# Patient Record
Sex: Female | Born: 1965 | Hispanic: Yes | Marital: Married | Smoking: Never smoker
Health system: Southern US, Community
[De-identification: ages and names within clinical notes are randomized; demographics above are authoritative.]

## PROBLEM LIST (undated history)

## (undated) DIAGNOSIS — E785 Hyperlipidemia, unspecified: Secondary | ICD-10-CM

## (undated) DIAGNOSIS — E119 Type 2 diabetes mellitus without complications: Secondary | ICD-10-CM

## (undated) HISTORY — PX: NO PAST SURGERIES: SHX2092

---

## 2020-06-24 ENCOUNTER — Ambulatory Visit
Admission: EM | Admit: 2020-06-24 | Discharge: 2020-06-24 | Disposition: A | Discharge status: Home / Self Care | Payer: BLUE CROSS/BLUE SHIELD | Attending: Family Medicine | Admitting: Family Medicine

## 2020-06-24 ENCOUNTER — Other Ambulatory Visit: Payer: Self-pay

## 2020-06-24 DIAGNOSIS — M79641 Pain in right hand: Secondary | ICD-10-CM

## 2020-06-24 HISTORY — DX: Hyperlipidemia, unspecified: E78.5

## 2020-06-24 HISTORY — DX: Type 2 diabetes mellitus without complications: E11.9

## 2020-06-24 MED ORDER — PREDNISONE 10 MG PO TABS: ORAL_TABLET | ORAL | 0 refills | Status: DC

## 2020-06-24 NOTE — Discharge Instructions (Signed)
Medication as prescribed. ° °Follow up with PCP. ° °Take care ° °Dr. Kamarii Buren  °

## 2020-06-24 NOTE — ED Provider Notes (Signed)
[facility]    CSN: [id number] Arrival date & time: 06/24/20  1412      History   Chief Complaint Chief Complaint  Patient presents with  . Hand Pain    right   HPI   54 year old female presents with the above complaint.  Started approximately 2 weeks ago.  She reports pain at the [facility] joint of the right hand.  Was seen at [facility] on 10/7.  Was thought to be secondary to gout.  She was treated with indomethacin and oxycodone.  She has had some improvement but no resolution.  Pain currently 5/10 in severity.  No other associated symptoms.  No fall, trauma, injury.  No other complaints.  Past Medical History:  Diagnosis Date  . Diabetes mellitus without complication (HCC)   . Hyperlipidemia    Home Medications    Prior to Admission medications   Medication Sig Start Date End Date Taking? Authorizing Provider  atorvastatin (LIPITOR) 10 MG tablet Take 10 mg by mouth daily. 04/09/20  Yes [provider]  glimepiride (AMARYL) 2 MG tablet Take 2 mg by mouth daily. 04/09/20  Yes [provider]  metFORMIN (GLUCOPHAGE) 500 MG tablet Take 500 mg by mouth daily. 04/09/20  Yes [provider]  predniSONE (DELTASONE) 10 MG tablet 50 mg daily x 2 days, then 40 mg daily x 2 days, then 30 mg daily x 2 days, then 20 mg daily x 2 days, then 10 mg daily x 2 days. 06/24/20   Tommie Sams, DO   Social History Social History   Tobacco Use  . Smoking status: Never Smoker  . Smokeless tobacco: Never Used  Vaping Use  . Vaping Use: Never used  Substance Use Topics  . Alcohol use: Never  . Drug use: Never     Allergies   Patient has no known allergies.   Review of Systems Review of Systems  Musculoskeletal:       Right hand pain.   Physical Exam Triage Vital Signs ED Triage Vitals  Enc Vitals Group     BP 06/24/20 1532 111/80     Pulse Rate 06/24/20 1532 99     Resp 06/24/20 1532 18     Temp 06/24/20 1532 98.5 F (36.9 C)     Temp  Source 06/24/20 1532 Oral     SpO2 06/24/20 1532 97 %     Weight --      Height --      Head Circumference --      Peak Flow --      Pain Score 06/24/20 1537 5     Pain Loc --      Pain Edu? --      Excl. in GC? --    Updated Vital Signs BP 111/80 (BP Location: Right Arm)   Pulse 99   Temp 98.5 F (36.9 C) (Oral)   Resp 18   SpO2 97%   Visual Acuity Right Eye Distance:   Left Eye Distance:   Bilateral Distance:    Right Eye Near:   Left Eye Near:    Bilateral Near:     Physical Exam Constitutional:      General: She is not in acute distress.    Appearance: Normal appearance. She is not ill-appearing.  Eyes:     General:        Right eye: No discharge.        Left eye: No discharge.     Conjunctiva/sclera: Conjunctivae  normal.  Pulmonary:     Effort: Pulmonary effort is normal. No respiratory distress.  Musculoskeletal:     Comments: Right hand with tenderness at the [facility] joint.  No warmth.  No erythema.  Neurological:     Mental Status: She is alert.  Psychiatric:        Mood and Affect: Mood normal.        Behavior: Behavior normal.    UC Treatments / Results  Labs (all labs ordered are listed, but only abnormal results are displayed) Labs Reviewed - No data to display  EKG   Radiology No results found.  Procedures Procedures (including critical care time)  Medications Ordered in UC Medications - No data to display  Initial Impression / Assessment and Plan / UC Course  I have reviewed the triage vital signs and the nursing notes.  Pertinent labs & imaging results that were available during my care of the patient were reviewed by me and considered in my medical decision making (see chart for details).    54 year old female presents with pain at the [facility] joint.  Patient recently seen by [facility].  X-ray results reviewed.  Had some improvement with indomethacin.  Trial of prednisone.  Final Clinical Impressions(s) / UC Diagnoses   Final diagnoses:    Right hand pain     Discharge Instructions     Medication as prescribed.  Follow up with PCP.  Take care  Dr. Adriana Simas    ED Prescriptions    Medication Sig Dispense Auth. Provider   predniSONE (DELTASONE) 10 MG tablet 50 mg daily x 2 days, then 40 mg daily x 2 days, then 30 mg daily x 2 days, then 20 mg daily x 2 days, then 10 mg daily x 2 days. 30 tablet Tommie Sams, DO     PDMP not reviewed this encounter.   Tommie Sams, [place] 06/24/20 1732

## 2020-06-24 NOTE — ED Triage Notes (Addendum)
Patient complains of right hand pain that started a few weeks ago. States that are has been swollen at the base of thumb, denies injury to area.Patient saw [facility] [place] for this on 10/07 and rxed Indomethacin and oxycodone without relief.     Patient son is interpreting for patient, unable to get [employer] interpreting services to answer.

## 2021-05-06 ENCOUNTER — Ambulatory Visit (INDEPENDENT_AMBULATORY_CARE_PROVIDER_SITE_OTHER): Payer: Self-pay

## 2021-05-06 ENCOUNTER — Ambulatory Visit
Admission: EM | Admit: 2021-05-06 | Discharge: 2021-05-06 | Disposition: A | Discharge status: Home / Self Care | Payer: Self-pay | Attending: Physician Assistant | Admitting: Physician Assistant

## 2021-05-06 DIAGNOSIS — M7741 Metatarsalgia, right foot: Secondary | ICD-10-CM

## 2021-05-06 DIAGNOSIS — M25571 Pain in right ankle and joints of right foot: Secondary | ICD-10-CM

## 2021-05-06 DIAGNOSIS — M79671 Pain in right foot: Secondary | ICD-10-CM

## 2021-05-06 MED ORDER — MELOXICAM 7.5 MG PO TABS: 7.5000 mg | ORAL_TABLET | Freq: Every day | ORAL | 0 refills | Status: AC

## 2021-05-06 MED ORDER — IBUPROFEN 400 MG PO TABS
400.0000 mg | ORAL_TABLET | Freq: Once | ORAL | Status: AC
  Administered 2021-05-06: 400 mg via ORAL

## 2021-05-06 MED ORDER — MELOXICAM 7.5 MG PO TABS: 7.5000 mg | ORAL_TABLET | Freq: Every day | ORAL | 0 refills | Status: DC

## 2021-05-06 NOTE — ED Provider Notes (Signed)
[facility]    CSN: [id number] Arrival date & time: 05/06/21  1255      History   Chief Complaint Chief Complaint  Patient presents with   Foot Pain    right    HPI Martha Rush is a 55 y.o. female presenting with her son for complaints of right foot and ankle pain.  Patient says she was outside watering the garden yesterday and he is unsure if she twisted the foot but no known injury.  She apparently did get a lot of water in her sock and shoe and he is unsure if that has anything to do with her pain.  No associated swelling, numbness tingling or weakness.  She does have increased pain when she bears weight on the foot.  No similar problem in the past.  Has taken Tylenol without improvement in pain.  Patient's past medical history significant for diabetes and hyperlipidemia.  Her son is translating for her today since her primary language is Arabic.  HPI  Past Medical History:  Diagnosis Date   Diabetes mellitus without complication (HCC)    Hyperlipidemia     There are no problems to display for this patient.   No past surgical history on file.  OB History   No obstetric history on file.      Home Medications    Prior to Admission medications   Medication Sig Start Date End Date Taking? Authorizing Provider  atorvastatin (LIPITOR) 10 MG tablet Take 10 mg by mouth daily. 04/09/20  Yes [provider]  glimepiride (AMARYL) 2 MG tablet Take 2 mg by mouth daily. 04/09/20  Yes [provider]  meloxicam (MOBIC) 7.5 MG tablet Take 1 tablet (7.5 mg total) by mouth daily. 05/06/21 06/05/21 Yes Shirlee Latch, PA-C  metFORMIN (GLUCOPHAGE) 500 MG tablet Take 500 mg by mouth daily. 04/09/20  Yes [provider]  predniSONE (DELTASONE) 10 MG tablet 50 mg daily x 2 days, then 40 mg daily x 2 days, then 30 mg daily x 2 days, then 20 mg daily x 2 days, then 10 mg daily x 2 days. 06/24/20   Tommie Sams, DO    Family History No family history on  file.  Social History Social History   Tobacco Use   Smoking status: Never   Smokeless tobacco: Never  Vaping Use   Vaping Use: Never used  Substance Use Topics   Alcohol use: Never   Drug use: Never     Allergies   Patient has no known allergies.   Review of Systems Review of Systems  Musculoskeletal:  Positive for arthralgias. Negative for gait problem and joint swelling.  Skin:  Negative for color change, rash and wound.  Neurological:  Negative for weakness and numbness.    Physical Exam Triage Vital Signs ED Triage Vitals  Enc Vitals Group     BP 05/06/21 1323 108/63     Pulse Rate 05/06/21 1323 86     Resp 05/06/21 1323 18     Temp 05/06/21 1323 98.6 F (37 C)     Temp Source 05/06/21 1323 Oral     SpO2 05/06/21 1323 99 %     Weight 05/06/21 1321 140 lb (63.5 kg)     Height 05/06/21 1322 5\' 3"  (1.6 m)     Head Circumference --      Peak Flow --      Pain Score 05/06/21 1321 10     Pain Loc --  Pain Edu? --      Excl. in GC? --    No data found.  Updated Vital Signs BP 108/63 (BP Location: Left Arm)   Pulse 86   Temp 98.6 F (37 C) (Oral)   Resp 18   Ht 5\' 3"  (1.6 m)   Wt 140 lb (63.5 kg)   SpO2 99%   BMI 24.80 kg/m      Physical Exam Vitals and nursing note reviewed.  Constitutional:      General: She is not in acute distress.    Appearance: Normal appearance. She is not ill-appearing or toxic-appearing.  HENT:     Head: Normocephalic and atraumatic.  Eyes:     General: No scleral icterus.       Right eye: No discharge.        Left eye: No discharge.     Conjunctiva/sclera: Conjunctivae normal.  Cardiovascular:     Rate and Rhythm: Normal rate and regular rhythm.     Pulses: Normal pulses.  Pulmonary:     Effort: Pulmonary effort is normal. No respiratory distress.  Musculoskeletal:     Cervical back: Neck supple.     Right ankle: Swelling present. Tenderness present over the lateral malleolus and ATF ligament. Normal range of  motion (mild lateral ankle swelling). Normal pulse.     Right foot: Normal range of motion. Swelling (mild swelling diffusely of metatarsals) and tenderness (TTP diffusely of  metatarsals) present.  Skin:    General: Skin is dry.  Neurological:     General: No focal deficit present.     Mental Status: She is alert. Mental status is at baseline.     Motor: No weakness.     Gait: Gait normal.  Psychiatric:        Mood and Affect: Mood normal.        Behavior: Behavior normal.        Thought Content: Thought content normal.     UC Treatments / Results  Labs (all labs ordered are listed, but only abnormal results are displayed) Labs Reviewed - No data to display  EKG   Radiology DG Ankle Complete Right  Result Date: 05/06/2021 CLINICAL DATA:  Right foot and ankle pain EXAM: RIGHT FOOT COMPLETE - 3+ VIEW; RIGHT ANKLE - COMPLETE 3+ VIEW COMPARISON:  None. FINDINGS: There is no evidence of fracture or dislocation of the right foot or ankle. No cortical thickening or periostitis. Small os peroneum. There is no evidence of arthropathy or other focal bone abnormality. Soft tissues are unremarkable. IMPRESSION: Negative. Electronically Signed   By: 05/08/2021 D.O.   On: 05/06/2021 14:28   DG Foot Complete Right  Result Date: 05/06/2021 CLINICAL DATA:  Right foot and ankle pain EXAM: RIGHT FOOT COMPLETE - 3+ VIEW; RIGHT ANKLE - COMPLETE 3+ VIEW COMPARISON:  None. FINDINGS: There is no evidence of fracture or dislocation of the right foot or ankle. No cortical thickening or periostitis. Small os peroneum. There is no evidence of arthropathy or other focal bone abnormality. Soft tissues are unremarkable. IMPRESSION: Negative. Electronically Signed   By: 05/08/2021 D.O.   On: 05/06/2021 14:28    Procedures Procedures (including critical care time)  Medications Ordered in UC Medications  ibuprofen (ADVIL) tablet 400 mg (400 mg Oral Given 05/06/21 1429)    Initial Impression /  Assessment and Plan / UC Course  I have reviewed the triage vital signs and the nursing notes.  Pertinent labs & imaging results that  were available during my care of the patient were reviewed by me and considered in my medical decision making (see chart for details).  55 year old female presenting with son for right foot and ankle pain since yesterday.  No specific injury.  Has taken Tylenol without improvement in her pain.  No associated numbness, weakness or tingling.  No fever or skin color changes.  X-rays of foot and ankle obtained today and independently viewed by me.  X-rays are negative.  Suspect metatarsalgia.  Treating this time with following RICE guidelines.  Additionally I have sent in meloxicam and advised use of over-the-counter Voltaren gel and continue Tylenol as needed.  Elevate and ice the foot.  She should be seen by [facility] if this is not improving in the next couple of weeks or symptoms worsen.  Final Clinical Impressions(s) / UC Diagnoses   Final diagnoses:  Foot pain, right  Acute right ankle pain  Metatarsalgia of right foot     Discharge Instructions      FOOT PAIN: X-rays are normal. Stressed avoiding painful activities . Reviewed RICE guidelines. Use medications as directed, including NSAIDs (meloxicam as prescribed). If no NSAIDs have been prescribed for you today, you may take Aleve or Motrin over the counter. May use Tylenol in between doses of NSAIDs.  Also consider use of OTC Voltaren gel and ACE wrap. If no improvement in the next 1-2 weeks, f/u with PCP or return to our office for reexamination, and please feel free to call or return at any time for any questions or concerns you may have and we will be happy to help you!         ED Prescriptions     Medication Sig Dispense Auth. Provider   meloxicam (MOBIC) 7.5 MG tablet Take 1 tablet (7.5 mg total) by mouth daily. 30 tablet Gareth Morgan      PDMP not reviewed this encounter.    Shirlee Latch, PA-C 05/06/21 1438

## 2021-05-06 NOTE — Discharge Instructions (Addendum)
FOOT PAIN: X-rays are normal. Stressed avoiding painful activities . Reviewed RICE guidelines. Use medications as directed, including NSAIDs (meloxicam as prescribed). If no NSAIDs have been prescribed for you today, you may take Aleve or Motrin over the counter. May use Tylenol in between doses of NSAIDs.  Also consider use of OTC Voltaren gel and ACE wrap. If no improvement in the next 1-2 weeks, f/u with PCP or return to our office for reexamination, and please feel free to call or return at any time for any questions or concerns you may have and we will be happy to help you!

## 2021-05-06 NOTE — ED Triage Notes (Signed)
Pt here with son who translates that Pt was outside watering the garden, water got into shoe and sock and since then foot has been in pain. No injury.

## 2023-02-10 ENCOUNTER — Other Ambulatory Visit: Payer: Self-pay

## 2023-02-10 ENCOUNTER — Ambulatory Visit: Payer: Self-pay | Admitting: Gerontology

## 2023-02-10 ENCOUNTER — Encounter: Payer: Self-pay | Admitting: Gerontology

## 2023-02-10 VITALS — BP 96/64 | HR 69 | Temp 98.0°F | Resp 16 | Ht 62.0 in | Wt 132.8 lb

## 2023-02-10 DIAGNOSIS — Z7689 Persons encountering health services in other specified circumstances: Secondary | ICD-10-CM | POA: Insufficient documentation

## 2023-02-10 DIAGNOSIS — E119 Type 2 diabetes mellitus without complications: Secondary | ICD-10-CM

## 2023-02-10 LAB — POCT GLYCOSYLATED HEMOGLOBIN (HGB A1C): Hemoglobin A1C: 7.5 % — AB (ref 4.0–5.6)

## 2023-02-10 LAB — GLUCOSE, POCT (MANUAL RESULT ENTRY): POC Glucose: 138 mg/dl — AB (ref 70–99)

## 2023-02-10 MED ORDER — LANCET DEVICE MISC
1.0000 | Freq: Three times a day (TID) | 0 refills | Status: AC
  Filled 2023-02-10: qty 1, 30d supply, fill #0

## 2023-02-10 MED ORDER — BLOOD GLUCOSE MONITOR SYSTEM W/DEVICE KIT
1.0000 | PACK | Freq: Three times a day (TID) | 0 refills | Status: AC
  Filled 2023-02-10: qty 1, 30d supply, fill #0

## 2023-02-10 MED ORDER — METFORMIN HCL 500 MG PO TABS
500.0000 mg | ORAL_TABLET | Freq: Two times a day (BID) | ORAL | 0 refills | Status: DC
  Filled 2023-02-10: qty 60, 30d supply, fill #0

## 2023-02-10 MED ORDER — BLOOD GLUCOSE TEST VI STRP
1.0000 | ORAL_STRIP | Freq: Three times a day (TID) | 0 refills | Status: AC
  Filled 2023-02-10: qty 100, 34d supply, fill #0

## 2023-02-10 MED ORDER — ACCU-CHEK SOFTCLIX LANCETS MISC
1.0000 | Freq: Three times a day (TID) | 0 refills | Status: AC
  Filled 2023-02-10: qty 100, 30d supply, fill #0

## 2023-02-10 NOTE — Progress Notes (Signed)
New Patient Office Visit  Subjective    Patient ID: Martha Rush, female    DOB: Apr 17, 1966  Age: 57 y.o. MRN: [id number]  CC:  Chief Complaint  Patient presents with   Establish Care    Patient has a history of diabetes, fasting blood sugar was 133 this morning.    HPI Martha Rush  is a 57 y/o female who has a history of type 2 diabetes, hyperlipidemia, and presents to establish care. She has a history of type 2 diabetes and takes Metformin/Glibenclamide 500 mg/5 mg daily, Tripass XR 25.5.1000 mg. Her HgbA1c checked during visit was 7.5% and her blood glucose was 138 mg/dl. She states that she checks her blood glucose bid, and her fasting readings are usually less than 140 mg/dl. She denies hypo/hyperglycemic symptoms, peripheral neuropathy and performs daily foot checks. Overall, she states that she's doing well and offers no further complaint.   Outpatient Encounter Medications as of 02/10/2023  Medication Sig   Accu-Chek Softclix Lancets lancets Use to test blood sugar in the morning, at noon, and at bedtime.   Blood Glucose Monitoring Suppl (BLOOD GLUCOSE MONITOR SYSTEM) w/Device KIT Use to test blood sugar in the morning, at noon, and at bedtime.   Glucose Blood (BLOOD GLUCOSE TEST STRIPS) STRP Use to test blood sugar in the morning, at noon, and at bedtime.   Lancet Device MISC Use to test blood sugar in the morning, at noon, and at bedtime.   UNABLE TO FIND Take 0.5 tablets by mouth daily. Emivanz 500mg /5mg  (Metformin hydrochloride/Glibenclamide) patient takes at lunch time   UNABLE TO FIND Take 1 tablet by mouth daily before breakfast. TriPass XR 25/01/999 mg (Empagliflozin 25mg / Linagliptin 5mg / Metformin 1000mg    metFORMIN (GLUCOPHAGE) 500 MG tablet Take 1 tablet (500 mg total) by mouth 2 (two) times daily with a meal.   [DISCONTINUED] atorvastatin (LIPITOR) 10 MG tablet Take 10 mg by mouth daily. (Patient not taking: Reported on 02/10/2023)   [DISCONTINUED] glimepiride (AMARYL)  2 MG tablet Take 2 mg by mouth daily. (Patient not taking: Reported on 02/10/2023)   [DISCONTINUED] metFORMIN (GLUCOPHAGE) 500 MG tablet Take 500 mg by mouth daily. (Patient not taking: Reported on 02/10/2023)   [DISCONTINUED] predniSONE (DELTASONE) 10 MG tablet 50 mg daily x 2 days, then 40 mg daily x 2 days, then 30 mg daily x 2 days, then 20 mg daily x 2 days, then 10 mg daily x 2 days.   No facility-administered encounter medications on file as of 02/10/2023.    Past Medical History:  Diagnosis Date   Diabetes mellitus without complication (HCC)    Hyperlipidemia     Past Surgical History:  Procedure Laterality Date   NO PAST SURGERIES      Family History  Problem Relation Age of Onset   Diabetes Mother    Heart disease Mother    Diabetes Father    Hyperlipidemia Father    Hypertension Father    Alcohol abuse Father    Cirrhosis Father    Diabetes Sister    Other Maternal Grandmother        unknown medical history   Other Maternal Grandfather        unknown medical history   Other Paternal Grandmother        unknown medical history   Other Paternal Grandfather        unknown medical history    Social History   Socioeconomic History   Marital status: Married    Spouse name:  Not on file   Number of children: Not on file   Years of education: Not on file   Highest education level: Not on file  Occupational History   Not on file  Tobacco Use   Smoking status: Never   Smokeless tobacco: Never  Vaping Use   Vaping Use: Never used  Substance and Sexual Activity   Alcohol use: Never   Drug use: Never   Sexual activity: Not on file  Other Topics Concern   Not on file  Social History Narrative   Not on file   Social Determinants of Health   Financial Resource Strain: Not on file  Food Insecurity: No Food Insecurity (02/10/2023)   Hunger Vital Sign    Worried About Running Out of Food in the Last Year: Never true    Ran Out of Food in the Last Year: Never true   Transportation Needs: No Transportation Needs (02/10/2023)   PRAPARE - Administrator, Civil Service (Medical): No    Lack of Transportation (Non-Medical): No  Physical Activity: Not on file  Stress: Not on file  Social Connections: Not on file  Intimate Partner Violence: Not At Risk (02/10/2023)   Humiliation, Afraid, Rape, and Kick questionnaire    Fear of Current or Ex-Partner: No    Emotionally Abused: No    Physically Abused: No    Sexually Abused: No    Review of Systems  Constitutional: Negative.   HENT: Negative.    Eyes: Negative.   Respiratory: Negative.    Cardiovascular: Negative.   Gastrointestinal: Negative.   Genitourinary: Negative.   Musculoskeletal: Negative.   Skin: Negative.   Neurological:  Positive for tingling (.intermittent neuropathy).  Endo/Heme/Allergies: Negative.   Psychiatric/Behavioral: Negative.          Objective    BP 96/64 (BP Location: Left Arm, Patient Position: Sitting, Cuff Size: Normal)   Pulse 69   Temp 98 F (36.7 C) (Oral)   Resp 16   Ht 5\' 2"  (1.575 m)   Wt 132 lb 12.8 oz (60.2 kg)   SpO2 98%   BMI 24.29 kg/m   Physical Exam HENT:     Head: Normocephalic and atraumatic.     Right Ear: Tympanic membrane normal.     Left Ear: Tympanic membrane normal.     Nose: Nose normal.     Mouth/Throat:     Mouth: Mucous membranes are moist.  Eyes:     Extraocular Movements: Extraocular movements intact.     Conjunctiva/sclera: Conjunctivae normal.     Pupils: Pupils are equal, round, and reactive to light.  Cardiovascular:     Rate and Rhythm: Normal rate and regular rhythm.     Pulses: Normal pulses.     Heart sounds: Normal heart sounds.  Pulmonary:     Effort: Pulmonary effort is normal.     Breath sounds: Normal breath sounds.  Abdominal:     General: Abdomen is flat. Bowel sounds are normal.     Palpations: Abdomen is soft.  Genitourinary:    Comments: Deferred per patient Musculoskeletal:         General: Normal range of motion.     Cervical back: Normal range of motion.  Skin:    General: Skin is warm.  Neurological:     General: No focal deficit present.     Mental Status: She is alert and oriented to person, place, and time. Mental status is at baseline.  Psychiatric:  Mood and Affect: Mood normal.        Behavior: Behavior normal.        Thought Content: Thought content normal.        Judgment: Judgment normal.         Assessment & Plan:     1. Encounter to establish care - Routine labs will be checked - CBC w/Diff; Future - Comp Met (CMET); Future - HgB A1c; Future - Lipid panel; Future - Urine Microalbumin w/creat. ratio; Future - Urine Microalbumin w/creat. ratio - Lipid panel - HgB A1c - Comp Met (CMET) - CBC w/Diff  2. Type 2 diabetes mellitus without complication, without long-term current use of insulin (HCC) - Her HgbA1c was 7.5%, her goal should be less than 7%, was started on Metformin, educated on medication side effects and advised to notify clinic. She will continue on low carb/non concentrated sweet diet and exercise as tolerated. She was provided with glucometer, advised to check, record and bring log to follow up appointment. - metFORMIN (GLUCOPHAGE) 500 MG tablet; Take 1 tablet (500 mg total) by mouth 2 (two) times daily with a meal.  Dispense: 60 tablet; Refill: 0 - Blood Glucose Monitoring Suppl (BLOOD GLUCOSE MONITOR SYSTEM) w/Device KIT; Use to test blood sugar in the morning, at noon, and at bedtime.  Dispense: 1 kit; Refill: 0 - Glucose Blood (BLOOD GLUCOSE TEST STRIPS) STRP; Use to test blood sugar in the morning, at noon, and at bedtime.  Dispense: 100 strip; Refill: 0 - Lancet Device MISC; Use to test blood sugar in the morning, at noon, and at bedtime.  Dispense: 1 each; Refill: 0 - Accu-Chek Softclix Lancets lancets; Use to test blood sugar in the morning, at noon, and at bedtime.  Dispense: 100 each; Refill: 0   Return in  about 22 days (around 03/04/2023), or if symptoms worsen or fail to improve.   Jenavieve Freda Trellis Paganini, NP

## 2023-02-10 NOTE — Patient Instructions (Signed)
???? ???????????? ???????? ????? ?????? ?? ???????? Carbohydrate Counting for Diabetes Mellitus, Adult ???? ???????????? ????? ??????? ???? ???????????? ???? ????????. ?????? ???????????? ???? ??? ????? ???? ?????? (????????) ?? ?????. ??? ??? ???? ??? ???????????? ???? ???????? ????? ?? ?????? ??? ????? ???????? ?? ?????. ???? ????? ?????? ??? ??????? ?? ??? ??????. ????? ???? ???????????? ??????? (??) ??? ????. ??? ?? ??????? ????? ??? ???????????? ???? ????? ??????? ????? ?? ?? ????. ????? ??? ??????? ??????? ??? ???. ?????? ??????? ??????? ??????? ?? ??? ??? ??????? ????? ??? ???????????? ???? ?????? ??????? ?? ?? ???? ?????? ??? ???? ?????. ?? ??????? ???? ????? ??? ?????????????  ?????? ???????????? ?? ??????? ???????:  ?????? ??? ????????? ???????.  ????????? ??????? ??????? ??????.  ????????? ??????? ??? ??????? ????????? ??????.  ??????? ?????? ???????.  ????? ????????.  ???????? ?????????? ??????? ??? ????? ?????? ????????? ???????? ??????? ?????? ???????. ??? ?????? ???? ???????????? ?? ??????? ??? ??????? ??????? ???? ???????????? ???????? ?? ??????. ????? ????? ?????? ?????? ?? ????? ??? ??????? ???????? ??? ??? ?? ??????. ????? ??????? ???? ????? ????????? ?? ??????. ????????? ?? ???? ??????? ???????? ???? ??????? ???????? ???? ?? ???? ???????? ??????? ??? ???? ??????? ?????????? ??????? ???? ???????? ???????. ?????:  ??? ?????.  ??????? ?? ??????? ???????? ?? ?? ????? ??? ?? ??? ???????? (??) ?? ???????????? ?? ?? ???. ???????? ??????? ????????? ??? ???? ????? ???? ?????????. ???? ???? ????? ?? ??? ???????????? ?? ?? ???. ????? ????? ?????? ????? ?????? ???? ???????????? ???? ?????????. ????? ????? ????? ???????? ??????? ?????? ??? ????? ???? ????? ??? ???????????? ???? ???? ?? ?? ????? ???? ???????? ??? ??????? ????????? ?????? ???? ???? ????? ?????? ?? ???? ????? ????????????.  ??? ????? ??????? ???? ????????? ?????? ?????? ?? ??? ??????? ??? ??? ?????.  ??? ??? ????? ????  ????????? ?????? ???????.  ???? ??? ????? ?? 15. ??????? ??????? ???? ????? ??? ????????????? ??? ??? ????? ????? 15 ?????? ?? ?????????????. ? ??? ???? ??????? ??? ?????? ????? ?? 10 ?????? (300 ??) ?? ????????? ?????? ?? ?????? ????? ?30 ?? ?? ???????????? (?????  15 ?? = 30 ??).  ??????? ??????? ??? ?????? ?????????? ???? ???? ???? ??? ???? ?? ??? ???? ?? ??????? ???? ?????? ???? ?????? ???????????? ???????? ?? ?? ??? ?? ????? ??????. ????? ??????? ??????? ??? ????? ????? ???????? ??????? ??????? ?????? ?????????????. ????? ?? ?? ??? ????? ??? ????? 15 ?? ?? ????????????:  ????? ????? ?? ?????.  ???? ??????? ???? ???? ??? ????? (15 ??).  ? ??? ?? ??????? (53 ??) ?? ????? ?????? ?? ???????.   ??? ?? 3 ?????? (85 ??) ?? ????????? ?? ????? ?????? ?? ?????? ??????? ???????? ??????.   ??? ?? 3 ?????? (85 ??) ?? ???????? ???????? ??? ???????? ?? ???? ????? ?? ?????.   ??? ?? 4 ?????? (120 ??) ?? ???? ??????? ???????.   ??? ?? 3 ?????? (85 ??) ?? ??????? ???????? ?? ????????? ??  ?? 3 ?????? (85 ??) ?? ??????? ??????? ??????? ?? ?????.   ??? ?? 4 ?????? ????? (118 ??) ?? ???? ???????.  ??? ?? 8 ?????? ????? (237 ??) ?? ??????.  ????? ????? ?? 4 ?????? (106 ??).   ???? ?????? ?? 2 ????? (63 ??).  ??? ?? ???????? ?? 5 ?????? (150 ??).  3 ????? ?? ????? ????? (28.3 ??) ?? ??????. ?? ?? ???? ??? ????? ?????? ????????????? ????? ??? ???????????? ?? ??? ?????? ?? ????????? ???? ??????? ??????? ?????. ???? ?? ???????  3 ????? (85 ??) ?? ???? ??????.  ? ??? ?? 4 ?????? (106 ??) ?? ????? ?????.   ??? ??  3 ?????? (85 ??) ?? ?????.  ??? ?? 8 ?????? ????? (237 ??) ?? ??????.  ??? ?? 5 ?????? (150 ??) ?? ???????? ??????? ?????? ????? ?? ?????. ?????? ???????????? 1. ??? ????????? ???????? ??? ????????????:  ?????.  ?????.  ??????.  ????????. 2. ???? ??? ????? ???? ???????? ?? ?? ????:  ????? ?? ?????.  ??? ????? ?? ?????.  ??? ????? ?? ??????.  ??? ????? ?? ????????. 3. ???? ?? ??? ??  ????? ?? 15 ??:  ????? ?? ?????  15 ?? = 30 ??.  ??? ????? ?? ?????  15 ?? = 15 ??.  ??? ????? ?? ??????  15 ?? = 15 ??.  ??? ????? ?? ????????  15 ?? = 15 ??. 4. ???? ???? ??????? ?????? ?????? ???? ???????????? ???? ????????:  30 ?? + 15 ?? + 15 ?? + 15 ?? = 75 ?? ?? ???????????? ?? ???????. ?? ??????? ??????? ???? ?????? ?? ????? ??? ?????? ??????  ?? ??? ??????? ?? ?? ????? ??????.  ???? ???????? ??????? ???????? ???????? ??????? ???????? ??????? ??????? ?????? ?????????? ?????? ??????? ???????.  ???? ?????? ??????. ???? ??????? ???? ????? ??? ????? ???? ???? ???.  ????? ?? ??????? ???????? ???????? ???? ????? ??? ???????? ???????. ????? ???????  ???? ??? ?????? ??? ??????? ???? ?? ???????????? ?? ?? ???? ?????? ???? ???? ?????.  ??? ?????? ????? ?????? ?????? ????? ?????? ????????. ????? ?????? ??? ?????? ?? ?????????:  ??????? ????????? ?????? (American Diabetes Association):? [email or web address]  ????? ?????? ??????? ???????? ???? (Centers for Disease Control and Prevention):? [email or web address]  ???????? ??????? ???????? ???????? (Academy of Nutrition and Dietetics):? [email or web address]  ????? ?????? ????? ???? ?????? ???????? (Association of Diabetes Care & Education Specialists):? [email or web address] ????  ???? ???????????? ????? ??????? ???? ???????????? ???? ????????.  ?????? ???????????? ???? ??? ????? ???? ?????? (????????) ?? ???.  ??? ??? ???? ??? ???????????? ???? ???????? ????? ?? ?????? ??? ????? ???????? ?? ?????. ???? ????? ?????? ??? ??????? ?? ???? ??????.  ?????? ??????? ??????? ??????? ?? ??? ??? ??????? ????? ??? ???????????? ???? ?????? ??????? ?? ?? ???? ?????? ??? ???? ?????. ??? ????? ?? ??? ????????? ?? ???? ?????? ????????? ???? ?????? ???? ??????? ??????. ???? ?? ?????? ??? ????? ???? ?? ???? ?? ???? ??????? ??????.? Document Revised: 05/10/2020 Document Reviewed: 05/10/2020 Elsevier Patient Education  2024 [employer].

## 2023-02-11 ENCOUNTER — Other Ambulatory Visit: Payer: Self-pay

## 2023-02-11 LAB — LIPID PANEL
Chol/HDL Ratio: 3.5 ratio (ref 0.0–4.4)
Cholesterol, Total: 195 mg/dL (ref 100–199)
HDL: 55 mg/dL (ref >39)
LDL Chol Calc (NIH): 122 mg/dL — ABNORMAL HIGH (ref 0–99)
Triglycerides: 103 mg/dL (ref 0–149)
VLDL Cholesterol Cal: 18 mg/dL (ref 5–40)

## 2023-02-11 LAB — COMPREHENSIVE METABOLIC PANEL
ALT: 20 IU/L (ref 0–32)
AST: 19 IU/L (ref 0–40)
Albumin/Globulin Ratio: 1.6 (ref 1.2–2.2)
Albumin: 4.6 g/dL (ref 3.8–4.9)
Alkaline Phosphatase: 85 IU/L (ref 44–121)
BUN/Creatinine Ratio: 25 — ABNORMAL HIGH (ref 9–23)
BUN: 15 mg/dL (ref 6–24)
Bilirubin Total: 0.4 mg/dL (ref 0.0–1.2)
CO2: 26 mmol/L (ref 20–29)
Calcium: 9.6 mg/dL (ref 8.7–10.2)
Chloride: 102 mmol/L (ref 96–106)
Creatinine, Ser: 0.59 mg/dL (ref 0.57–1.00)
Globulin, Total: 2.8 g/dL (ref 1.5–4.5)
Glucose: 160 mg/dL — ABNORMAL HIGH (ref 70–99)
Potassium: 4.7 mmol/L (ref 3.5–5.2)
Sodium: 138 mmol/L (ref 134–144)
Total Protein: 7.4 g/dL (ref 6.0–8.5)
eGFR: 105 mL/min/{1.73_m2} (ref >59)

## 2023-02-11 LAB — CBC WITH DIFFERENTIAL/PLATELET
Basophils Absolute: 0 10*3/uL (ref 0.0–0.2)
Basos: 1 %
EOS (ABSOLUTE): 0.5 10*3/uL — ABNORMAL HIGH (ref 0.0–0.4)
Eos: 8 %
Hematocrit: 38.7 % (ref 34.0–46.6)
Hemoglobin: 12.8 g/dL (ref 11.1–15.9)
Immature Grans (Abs): 0 10*3/uL (ref 0.0–0.1)
Immature Granulocytes: 0 %
Lymphocytes Absolute: 2.4 10*3/uL (ref 0.7–3.1)
Lymphs: 39 %
MCH: 29.2 pg (ref 26.6–33.0)
MCHC: 33.1 g/dL (ref 31.5–35.7)
MCV: 88 fL (ref 79–97)
Monocytes Absolute: 0.4 10*3/uL (ref 0.1–0.9)
Monocytes: 6 %
Neutrophils Absolute: 2.9 10*3/uL (ref 1.4–7.0)
Neutrophils: 46 %
Platelets: 384 10*3/uL (ref 150–450)
RBC: 4.39 x10E6/uL (ref 3.77–5.28)
RDW: 12.2 % (ref 11.7–15.4)
WBC: 6.2 10*3/uL (ref 3.4–10.8)

## 2023-02-11 LAB — HEMOGLOBIN A1C
Est. average glucose Bld gHb Est-mCnc: 174 mg/dL
Hgb A1c MFr Bld: 7.7 % — ABNORMAL HIGH (ref 4.8–5.6)

## 2023-02-11 LAB — MICROALBUMIN / CREATININE URINE RATIO
Creatinine, Urine: 54.9 mg/dL
Microalb/Creat Ratio: <5 mg/g creat (ref 0–29)
Microalbumin, Urine: <3 ug/mL

## 2023-03-03 ENCOUNTER — Encounter: Payer: Self-pay | Admitting: Gerontology

## 2023-03-03 ENCOUNTER — Ambulatory Visit: Payer: Medicaid Other | Admitting: Gerontology

## 2023-03-03 ENCOUNTER — Other Ambulatory Visit: Payer: Self-pay

## 2023-03-03 VITALS — BP 110/71 | HR 71 | Temp 98.1°F | Resp 16 | Ht 62.0 in | Wt 135.3 lb

## 2023-03-03 DIAGNOSIS — E785 Hyperlipidemia, unspecified: Secondary | ICD-10-CM

## 2023-03-03 DIAGNOSIS — E119 Type 2 diabetes mellitus without complications: Secondary | ICD-10-CM

## 2023-03-03 MED ORDER — METFORMIN HCL 1000 MG PO TABS
1000.0000 mg | ORAL_TABLET | Freq: Two times a day (BID) | ORAL | 0 refills | Status: AC
  Filled 2023-03-03: qty 180, 90d supply, fill #0

## 2023-03-03 MED ORDER — METFORMIN HCL 1000 MG PO TABS
1000.0000 mg | ORAL_TABLET | Freq: Two times a day (BID) | ORAL | 1 refills | Status: DC
  Filled 2023-03-03: qty 120, 60d supply, fill #0

## 2023-03-03 NOTE — Progress Notes (Signed)
Established Patient Office Visit  Subjective   Patient ID: Martha Rush, female    DOB: 08/05/1966  Age: 57 y.o. MRN: [id number]  Chief Complaint  Patient presents with   Follow-up    Labs drawn 02/10/23   Diabetes    Patient brought blood sugar log for review today. Patient requesting more test strips today.    HPI  Martha Rush  is a 57 y/o female who has a history of type 2 diabetes, hyperlipidemia, and presents for follow up appointment and lab review. Her HgbA1c was 7.7% during last visit, she brought her blood glucose log and her fasting readings ranges between 130- 200 mg per DL, she denies hypo-/hyperglycemic symptoms,peripheral neuropathy and performs daily foot checks.  Her labs done on 02/10/2023 was unremarkable except LDL was 122 mg per DL.  She denies chest pain, palpitation, shortness of breath, vision changes.  Overall, she states that she is doing well and offers no further complaints.  Today will be her last visit because she has active Medicaid.    Review of Systems  Constitutional: Negative.   Eyes: Negative.   Respiratory: Negative.    Cardiovascular: Negative.   Genitourinary: Negative.   Neurological: Negative.   Endo/Heme/Allergies: Negative.       Objective:     BP 110/71 (BP Location: Right Arm, Patient Position: Sitting, Cuff Size: Normal)   Pulse 71   Temp 98.1 F (36.7 C) (Oral)   Resp 16   Ht 5\' 2"  (1.575 m)   Wt 135 lb 4.8 oz (61.4 kg)   SpO2 97%   BMI 24.75 kg/m  BP Readings from Last 3 Encounters:  03/03/23 110/71  02/10/23 96/64  05/06/21 108/63   Wt Readings from Last 3 Encounters:  03/03/23 135 lb 4.8 oz (61.4 kg)  02/10/23 132 lb 12.8 oz (60.2 kg)  05/06/21 140 lb (63.5 kg)      Physical Exam HENT:     Head: Normocephalic and atraumatic.     Mouth/Throat:     Mouth: Mucous membranes are moist.  Eyes:     Extraocular Movements: Extraocular movements intact.     Conjunctiva/sclera: Conjunctivae normal.     Pupils: Pupils  are equal, round, and reactive to light.  Cardiovascular:     Rate and Rhythm: Normal rate and regular rhythm.     Pulses: Normal pulses.     Heart sounds: Normal heart sounds.  Pulmonary:     Effort: Pulmonary effort is normal.     Breath sounds: Normal breath sounds.  Skin:    General: Skin is warm.  Neurological:     General: No focal deficit present.     Mental Status: She is alert and oriented to person, place, and time. Mental status is at baseline.  Psychiatric:        Mood and Affect: Mood normal.        Behavior: Behavior normal.        Thought Content: Thought content normal.        Judgment: Judgment normal.      No results found for any visits on 03/03/23.  Last CBC Lab Results  Component Value Date   WBC 6.2 02/10/2023   HGB 12.8 02/10/2023   HCT 38.7 02/10/2023   MCV 88 02/10/2023   MCH 29.2 02/10/2023   RDW 12.2 02/10/2023   PLT 384 02/10/2023   Last metabolic panel Lab Results  Component Value Date   GLUCOSE 160 (H) 02/10/2023   NA 138 02/10/2023  K 4.7 02/10/2023   CL 102 02/10/2023   CO2 26 02/10/2023   BUN 15 02/10/2023   CREATININE 0.59 02/10/2023   EGFR 105 02/10/2023   CALCIUM 9.6 02/10/2023   PROT 7.4 02/10/2023   ALBUMIN 4.6 02/10/2023   LABGLOB 2.8 02/10/2023   AGRATIO 1.6 02/10/2023   BILITOT 0.4 02/10/2023   ALKPHOS 85 02/10/2023   AST 19 02/10/2023   ALT 20 02/10/2023   Last lipids Lab Results  Component Value Date   CHOL 195 02/10/2023   HDL 55 02/10/2023   LDLCALC 122 (H) 02/10/2023   TRIG 103 02/10/2023   CHOLHDL 3.5 02/10/2023   Last hemoglobin A1c Lab Results  Component Value Date   HGBA1C 7.7 (H) 02/10/2023   Last thyroid functions No results found for: "TSH", "T3TOTAL", "T4TOTAL", "THYROIDAB" Last vitamin D No results found for: "25OHVITD2", "25OHVITD3", "VD25OH"    The 10-year ASCVD risk score (Arnett DK, et al., 2019) is: 3.4%    Assessment & Plan:   1. Type 2 diabetes mellitus without complication,  without long-term current use of insulin (HCC) - Her HgbA1c was 7.7%, goal should be less than 7%.  She was advised to continue checking her blood glucose twice daily, record, bring log to follow-up appointment.  She was advised fasting blood glucose reading goal be between 80 to 130 mg per DL.  She was encouraged to continue on low carbohydrate/no concentrated sweet diet.  Her metformin was increased to 1000 mg twice daily. - metFORMIN (GLUCOPHAGE) 1000 MG tablet; Take 1 tablet (1,000 mg total) by mouth 2 (two) times daily with a meal.  Dispense: 180 tablet; Refill: 0  2. Elevated lipids -The 10-year ASCVD risk score (Arnett DK, et al., 2019) is: 3.4%   Values used to calculate the score:     Age: 84 years     Sex: Female     Is Non-Hispanic African American: No     Diabetic: Yes     Tobacco smoker: No     Systolic Blood Pressure: 110 mmHg     Is BP treated: No     HDL Cholesterol: 55 mg/dL     Total Cholesterol: 195 mg/dL  ASCVD risk score 1.6%, patient was encouraged to continue on low-fat/cholesterol diet and exercise as tolerated.   No follow-ups on file. No follow-up appointment because she had active medicaid.  [facility] clinic wishes well with her care.   Ricahrd Schwager Trellis Paganini, NP

## 2023-03-03 NOTE — Progress Notes (Addendum)
AMN language services used for today's visit, id# [id number].

## 2023-03-03 NOTE — Patient Instructions (Signed)
???? ???????????? ???????? ????? ?????? ?? ???????? Carbohydrate Counting for Diabetes Mellitus, Adult ???? ???????????? ????? ??????? ???? ???????????? ???? ????????. ?????? ???????????? ???? ??? ????? ???? ?????? (????????) ?? ?????. ??? ??? ???? ??? ???????????? ???? ???????? ????? ?? ?????? ??? ????? ???????? ?? ?????. ???? ????? ?????? ??? ??????? ?? ??? ??????. ????? ???? ???????????? ??????? (??) ??? ????. ??? ?? ??????? ????? ??? ???????????? ???? ????? ??????? ????? ?? ?? ????. ????? ??? ??????? ??????? ??? ???. ?????? ??????? ??????? ??????? ?? ??? ??? ??????? ????? ??? ???????????? ???? ?????? ??????? ?? ?? ???? ?????? ??? ???? ?????. ?? ??????? ???? ????? ??? ?????????????  ?????? ???????????? ?? ??????? ???????:  ?????? ??? ????????? ???????.  ????????? ??????? ??????? ??????.  ????????? ??????? ??? ??????? ????????? ??????.  ??????? ?????? ???????.  ????? ????????.  ???????? ?????????? ??????? ??? ????? ?????? ????????? ???????? ??????? ?????? ???????. ??? ?????? ???? ???????????? ?? ??????? ??? ??????? ??????? ???? ???????????? ???????? ?? ??????. ????? ????? ?????? ?????? ?? ????? ??? ??????? ???????? ??? ??? ?? ??????. ????? ??????? ???? ????? ????????? ?? ??????. ????????? ?? ???? ??????? ???????? ???? ??????? ???????? ???? ?? ???? ???????? ??????? ??? ???? ??????? ?????????? ??????? ???? ???????? ???????. ?????:  ??? ?????.  ??????? ?? ??????? ???????? ?? ?? ????? ??? ?? ??? ???????? (??) ?? ???????????? ?? ?? ???. ???????? ??????? ????????? ??? ???? ????? ???? ?????????. ???? ???? ????? ?? ??? ???????????? ?? ?? ???. ????? ????? ?????? ????? ?????? ???? ???????????? ???? ?????????. ????? ????? ????? ???????? ??????? ?????? ??? ????? ???? ????? ??? ???????????? ???? ???? ?? ?? ????? ???? ???????? ??? ??????? ????????? ?????? ???? ???? ????? ?????? ?? ???? ????? ????????????.  ??? ????? ??????? ???? ????????? ?????? ?????? ?? ??? ??????? ??? ??? ?????.  ??? ??? ????? ????  ????????? ?????? ???????.  ???? ??? ????? ?? 15. ??????? ??????? ???? ????? ??? ????????????? ??? ??? ????? ????? 15 ?????? ?? ?????????????. ? ??? ???? ??????? ??? ?????? ????? ?? 10 ?????? (300 ??) ?? ????????? ?????? ?? ?????? ????? ?30 ?? ?? ???????????? (?????  15 ?? = 30 ??).  ??????? ??????? ??? ?????? ?????????? ???? ???? ???? ??? ???? ?? ??? ???? ?? ??????? ???? ?????? ???? ?????? ???????????? ???????? ?? ?? ??? ?? ????? ??????. ????? ??????? ??????? ??? ????? ????? ???????? ??????? ??????? ?????? ?????????????. ????? ?? ?? ??? ????? ??? ????? 15 ?? ?? ????????????:  ????? ????? ?? ?????.  ???? ??????? ???? ???? ??? ????? (15 ??).  ? ??? ?? ??????? (53 ??) ?? ????? ?????? ?? ???????.   ??? ?? 3 ?????? (85 ??) ?? ????????? ?? ????? ?????? ?? ?????? ??????? ???????? ??????.   ??? ?? 3 ?????? (85 ??) ?? ???????? ???????? ??? ???????? ?? ???? ????? ?? ?????.   ??? ?? 4 ?????? (120 ??) ?? ???? ??????? ???????.   ??? ?? 3 ?????? (85 ??) ?? ??????? ???????? ?? ????????? ??  ?? 3 ?????? (85 ??) ?? ??????? ??????? ??????? ?? ?????.   ??? ?? 4 ?????? ????? (118 ??) ?? ???? ???????.  ??? ?? 8 ?????? ????? (237 ??) ?? ??????.  ????? ????? ?? 4 ?????? (106 ??).   ???? ?????? ?? 2 ????? (63 ??).  ??? ?? ???????? ?? 5 ?????? (150 ??).  3 ????? ?? ????? ????? (28.3 ??) ?? ??????. ?? ?? ???? ??? ????? ?????? ????????????? ????? ??? ???????????? ?? ??? ?????? ?? ????????? ???? ??????? ??????? ?????. ???? ?? ???????  3 ????? (85 ??) ?? ???? ??????.  ? ??? ?? 4 ?????? (106 ??) ?? ????? ?????.   ??? ??   3 ?????? (85 ??) ?? ?????.  ??? ?? 8 ?????? ????? (237 ??) ?? ??????.  ??? ?? 5 ?????? (150 ??) ?? ???????? ??????? ?????? ????? ?? ?????. ?????? ???????????? 1. ??? ????????? ???????? ??? ????????????:  ?????.  ?????.  ??????.  ????????. 2. ???? ??? ????? ???? ???????? ?? ?? ????:  ????? ?? ?????.  ??? ????? ?? ?????.  ??? ????? ?? ??????.  ??? ????? ?? ????????. 3. ???? ?? ??? ??  ????? ?? 15 ??:  ????? ?? ?????  15 ?? = 30 ??.  ??? ????? ?? ?????  15 ?? = 15 ??.  ??? ????? ?? ??????  15 ?? = 15 ??.  ??? ????? ?? ????????  15 ?? = 15 ??. 4. ???? ???? ??????? ?????? ?????? ???? ???????????? ???? ????????:  30 ?? + 15 ?? + 15 ?? + 15 ?? = 75 ?? ?? ???????????? ?? ???????. ?? ??????? ??????? ???? ?????? ?? ????? ??? ?????? ??????  ?? ??? ??????? ?? ?? ????? ??????.  ???? ???????? ??????? ???????? ???????? ??????? ???????? ??????? ??????? ?????? ?????????? ?????? ??????? ???????.  ???? ?????? ??????. ???? ??????? ???? ????? ??? ????? ???? ???? ???.  ????? ?? ??????? ???????? ???????? ???? ????? ??? ???????? ???????. ????? ???????  ???? ??? ?????? ??? ??????? ???? ?? ???????????? ?? ?? ???? ?????? ???? ???? ?????.  ??? ?????? ????? ?????? ?????? ????? ?????? ????????. ????? ?????? ??? ?????? ?? ?????????:  ??????? ????????? ?????? (American Diabetes Association):? [email or web address]  ????? ?????? ??????? ???????? ???? (Centers for Disease Control and Prevention):? [email or web address]  ???????? ??????? ???????? ???????? (Academy of Nutrition and Dietetics):? [email or web address]  ????? ?????? ????? ???? ?????? ???????? (Association of Diabetes Care & Education Specialists):? [email or web address] ????  ???? ???????????? ????? ??????? ???? ???????????? ???? ????????.  ?????? ???????????? ???? ??? ????? ???? ?????? (????????) ?? ???.  ??? ??? ???? ??? ???????????? ???? ???????? ????? ?? ?????? ??? ????? ???????? ?? ?????. ???? ????? ?????? ??? ??????? ?? ???? ??????.  ?????? ??????? ??????? ??????? ?? ??? ??? ??????? ????? ??? ???????????? ???? ?????? ??????? ?? ?? ???? ?????? ??? ???? ?????. ??? ????? ?? ??? ????????? ?? ???? ?????? ????????? ???? ?????? ???? ??????? ??????. ???? ?? ?????? ??? ????? ???? ?? ???? ?? ???? ??????? ??????.? Document Revised: 05/10/2020 Document Reviewed: 05/10/2020 Elsevier Patient Education  2024 [employer]  

## 2023-04-24 IMAGING — CR DG FOOT COMPLETE 3+V*R*
3 series · 3 of 3 positions shown · non-contrast
Comparison: None.

CLINICAL DATA: Right foot and ankle pain

EXAM:
RIGHT FOOT COMPLETE - 3+ VIEW; RIGHT ANKLE - COMPLETE 3+ VIEW

[foot ap]
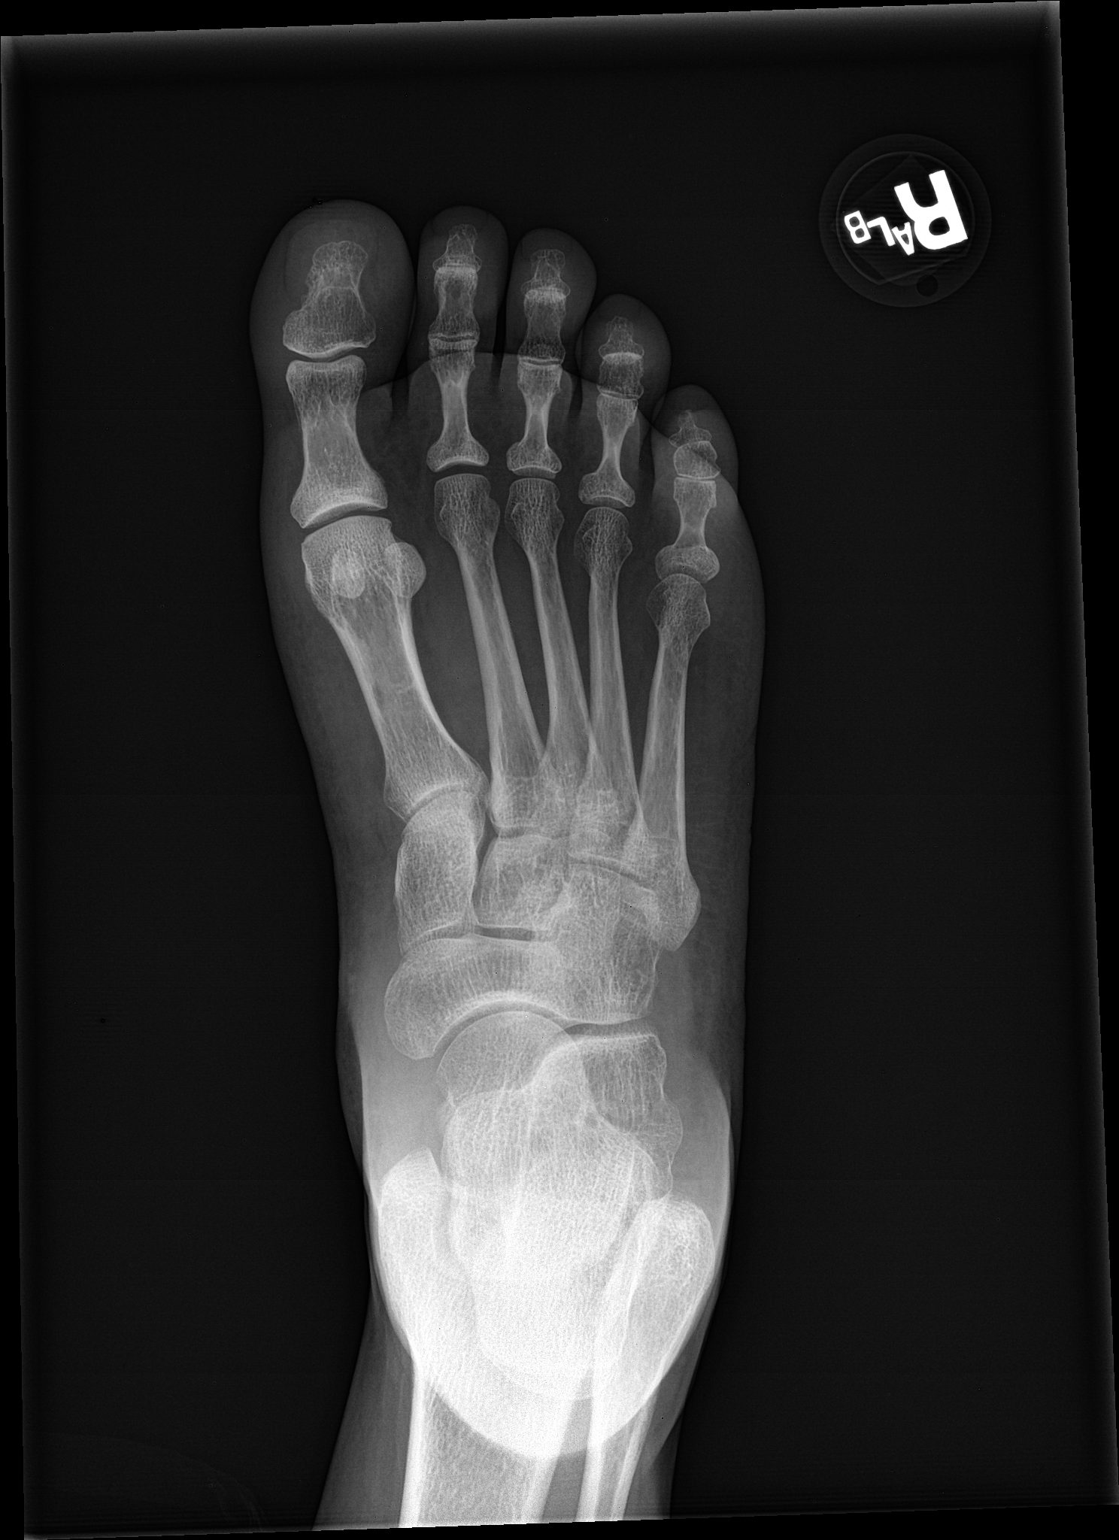

[foot obl]
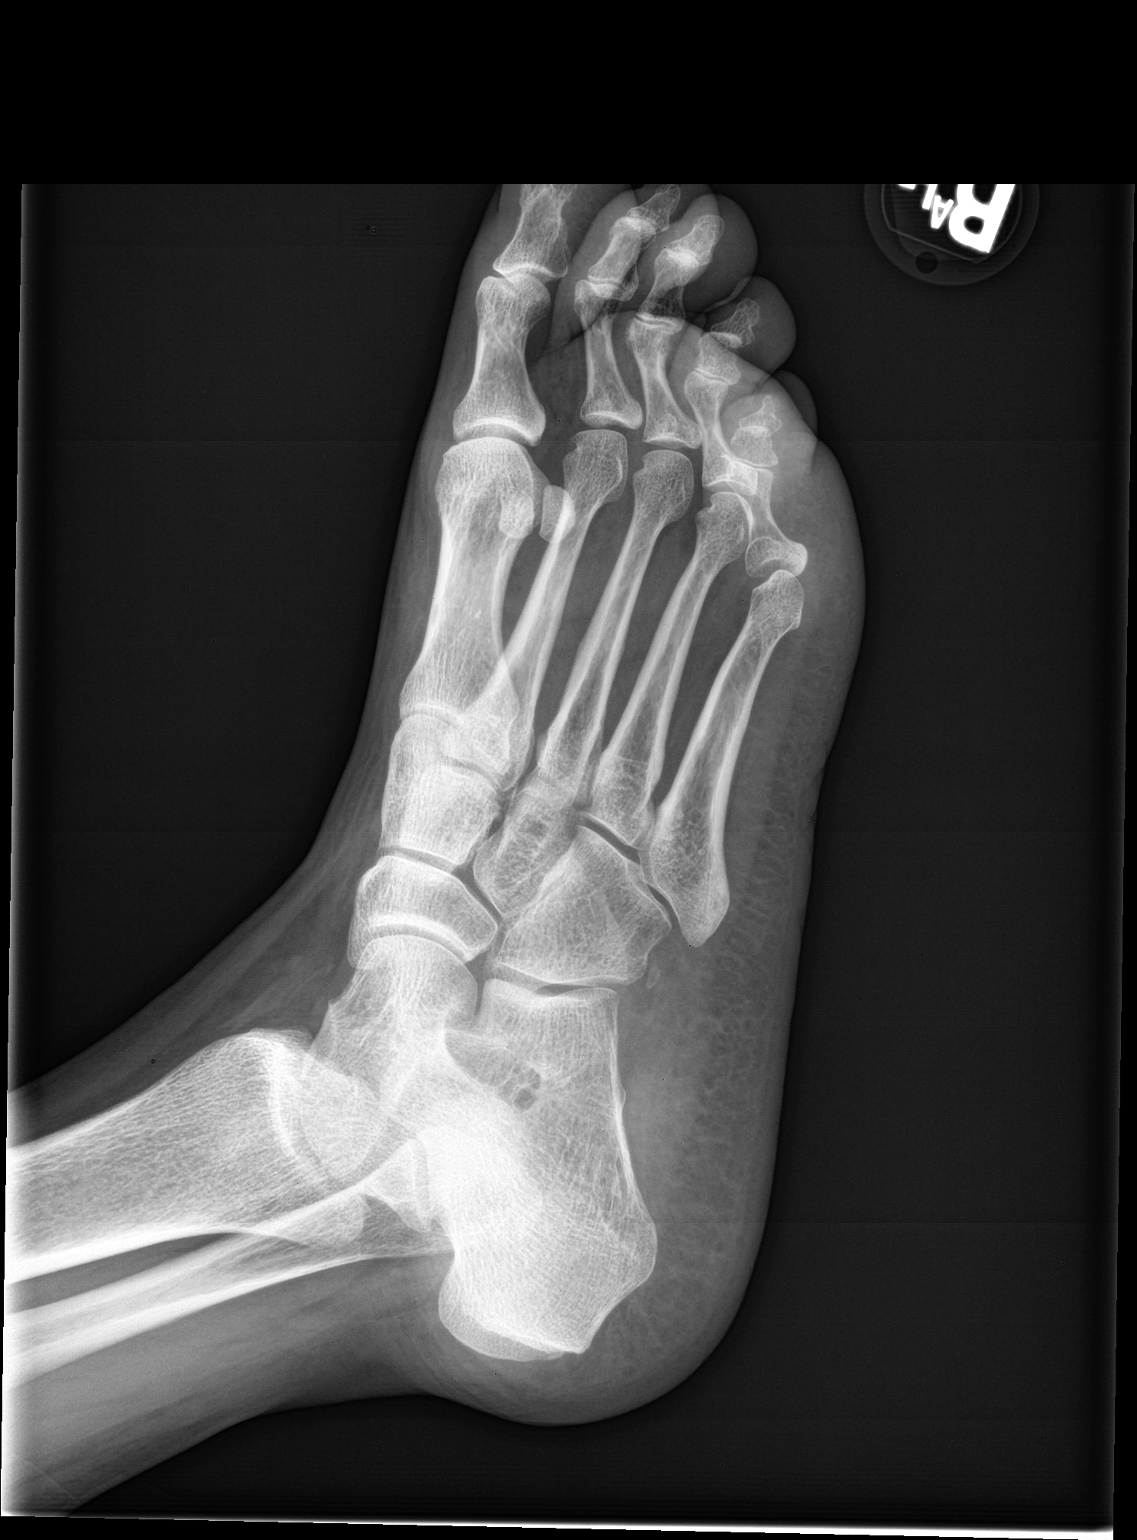

[foot lat]
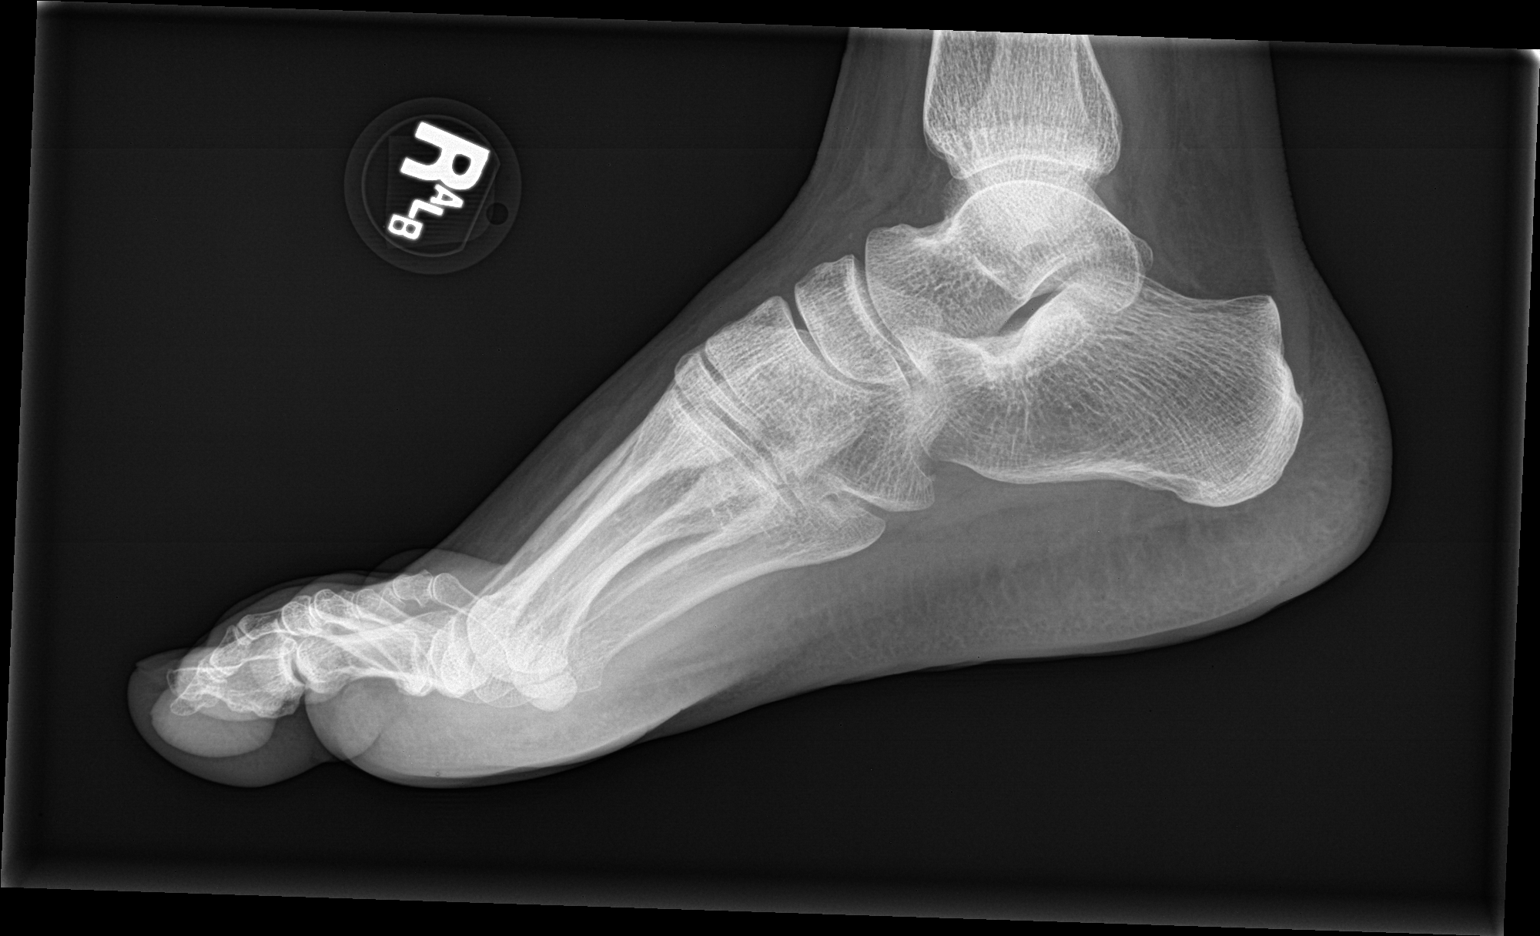

[3 of 3 positions shown; findings below may reference images not displayed]

FINDINGS: There is no evidence of fracture or dislocation of the right foot or
ankle. No cortical thickening or periostitis. Small os peroneum.
There is no evidence of arthropathy or other focal bone abnormality.
Soft tissues are unremarkable.
IMPRESSION: Negative.

## 2023-04-24 IMAGING — CR DG ANKLE COMPLETE 3+V*R*
3 series · 3 of 3 positions shown · non-contrast
Comparison: None.

CLINICAL DATA: Right foot and ankle pain

EXAM:
RIGHT FOOT COMPLETE - 3+ VIEW; RIGHT ANKLE - COMPLETE 3+ VIEW

[ankle ap]
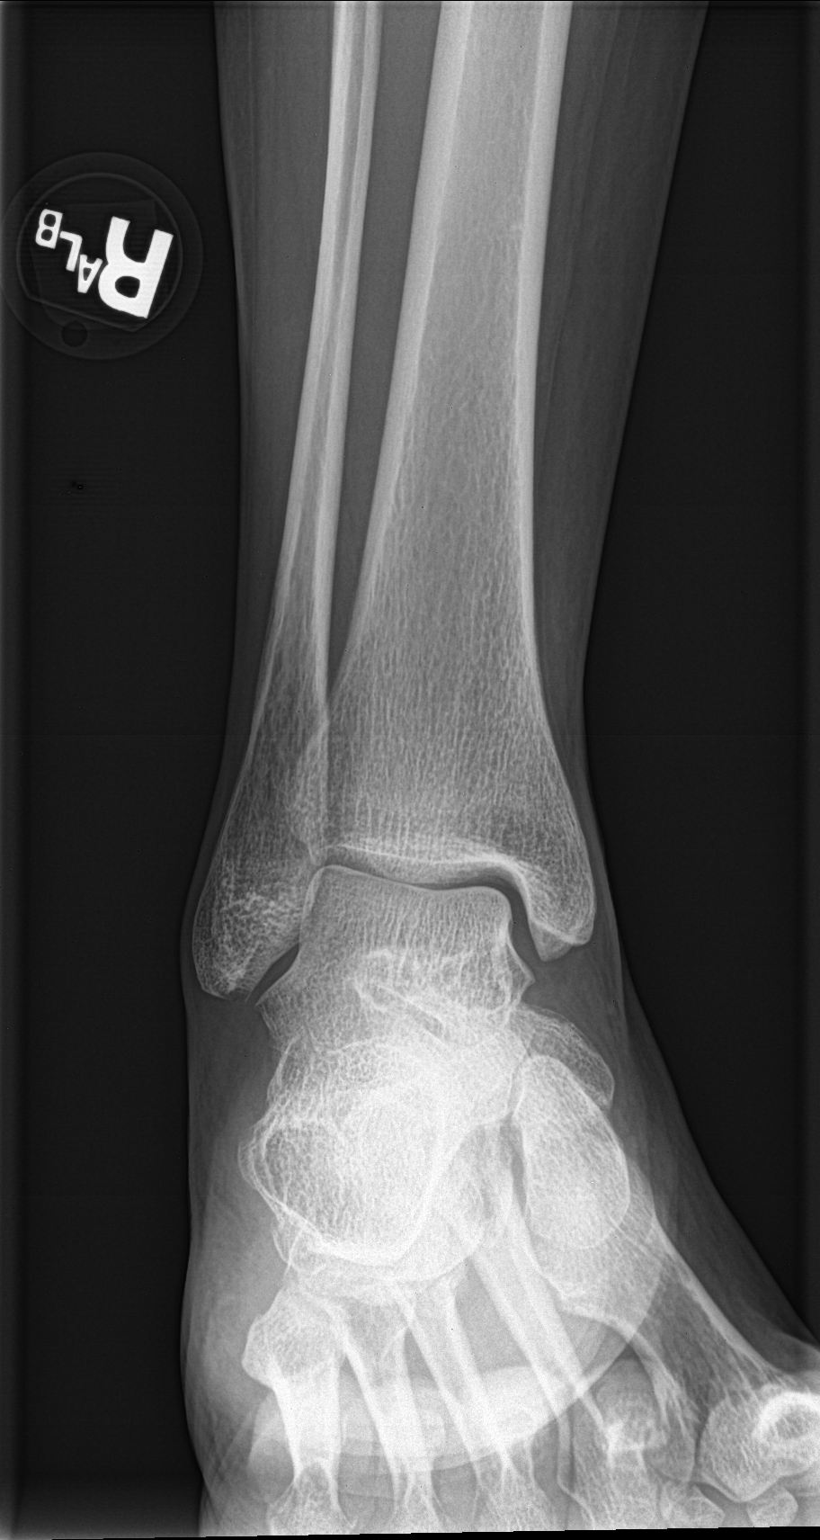

[ankle obl]
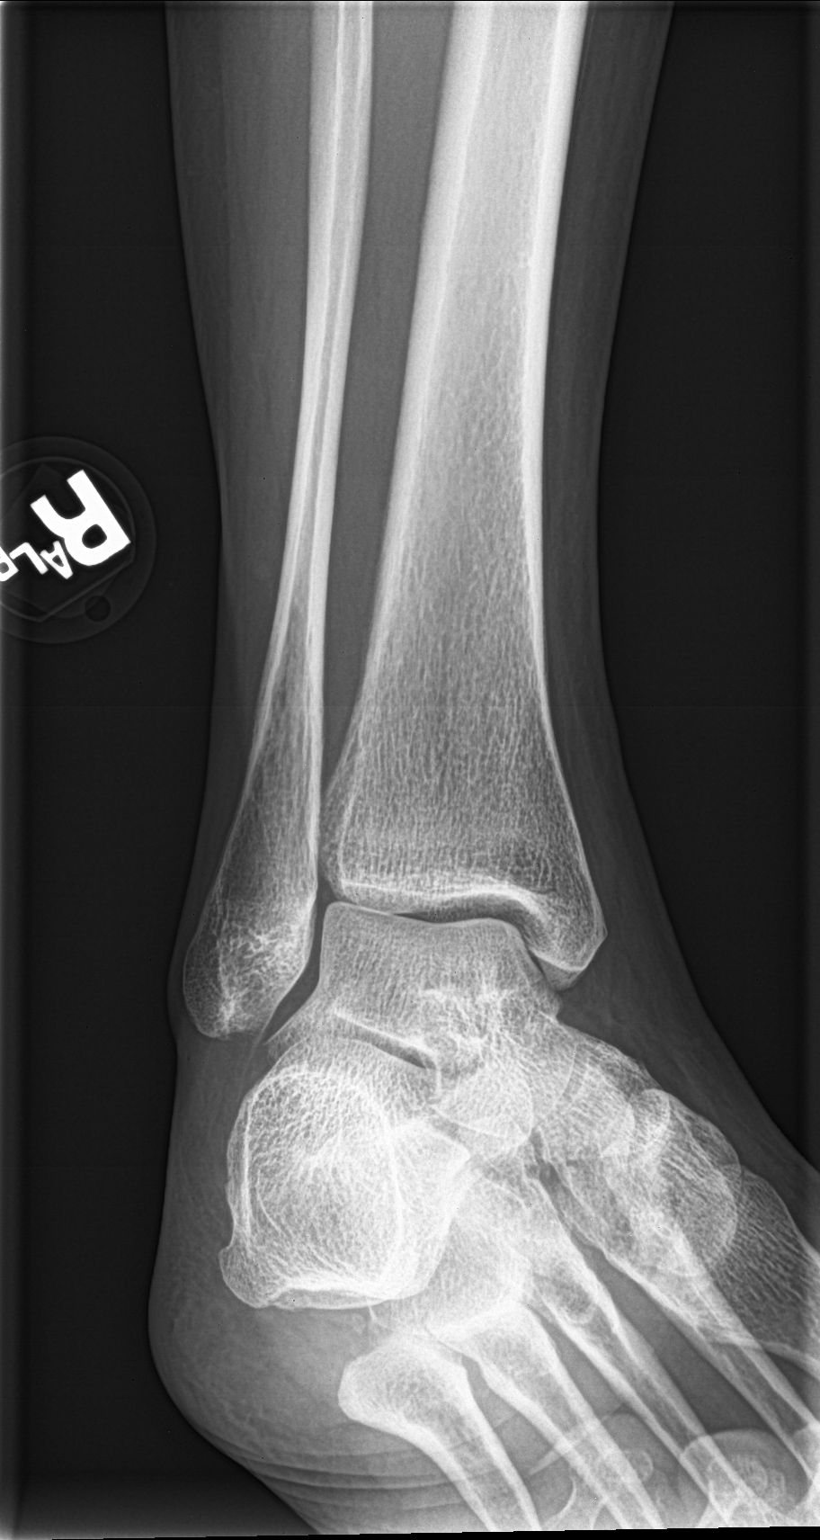

[ankle lat]
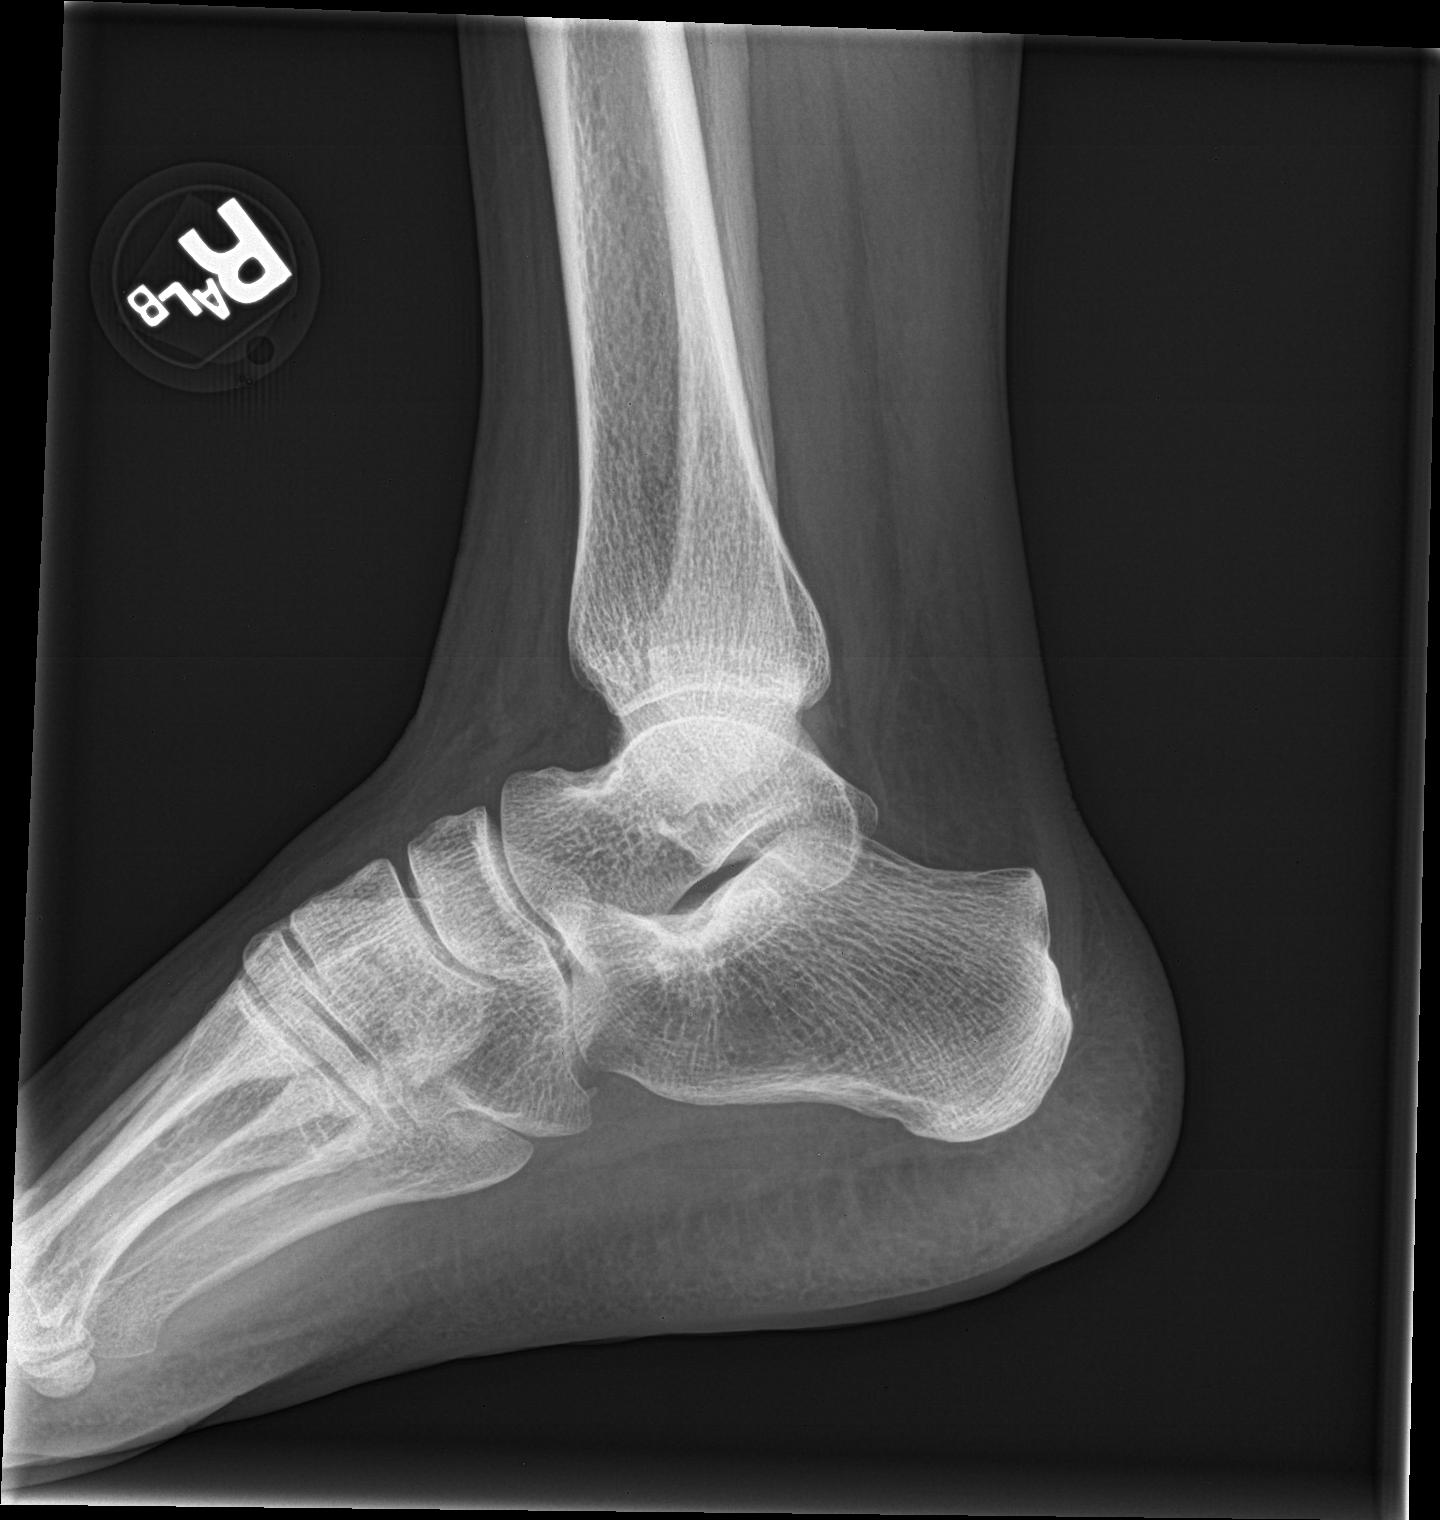

[3 of 3 positions shown; findings below may reference images not displayed]

FINDINGS: There is no evidence of fracture or dislocation of the right foot or
ankle. No cortical thickening or periostitis. Small os peroneum.
There is no evidence of arthropathy or other focal bone abnormality.
Soft tissues are unremarkable.
IMPRESSION: Negative.

## 2024-01-08 NOTE — ED Provider Notes (Signed)
 [facility] EMERGENCY DEPT  ED Provider Note History   Chief Complaint  Patient presents with  . Arm Injury   History of Present Illness HPI 58 year old female presents the emergency department for right arm pain.  Symptoms have been going on for about 4 months since a fall.  Feels her pain in her right shoulder and is unable to put her arm behind her back or raise it above her head.  Also has pain radiating from her shoulder down to her hand.  No recent trauma.  Has not seen a physician to follow-up for her pain.  Patient would like MRI. Past Medical History:  Diagnosis Date  . Diabetes mellitus without complication (CMS/HHS-HCC)   . GERD (gastroesophageal reflux disease)    History reviewed. No pertinent surgical history. Family History  Problem Relation Age of Onset  . No Known Problems Mother   . No Known Problems Father    Social History   Socioeconomic History  . Marital status: Married  Tobacco Use  . Smoking status: Never  . Smokeless tobacco: Never  Vaping Use  . Vaping status: Never Used  Substance and Sexual Activity  . Alcohol use: Never  . Drug use: Never  . Sexual activity: Yes    Partners: Male  Social History Narrative   Moved from [place] 1 year ago   Social Drivers of Health   Food Insecurity: No Food Insecurity (02/10/2023)   Received from [facility]   Hunger Vital Sign   . Worried About Programme researcher, broadcasting/film/video in the Last Year: Never true   . Ran Out of Food in the Last Year: Never true  Transportation Needs: No Transportation Needs (02/10/2023)   Received from [facility] - Transportation   . Lack of Transportation (Medical): No   . Lack of Transportation (Non-Medical): No  Housing Stability: Unknown (01/08/2024)   Housing Stability Vital Sign   . Homeless in the Last Year: No   Review of Systems  Physical Exam  BP 126/72   Pulse 82   Temp 36.3 C (97.4 F) (Oral)   Resp 18   SpO2 98%  Physical Exam Musculoskeletal:        General:  Tenderness present.     Comments: Tenderness to the right shoulder.  Has pain with raising her arm into abduction greater than 90 degrees, placing it behind her back and also with empty can test.  2+ pulses.  No sensation changes.  Patient able to cross her fingers, make okay sign, abduct her fingers without difficulty  Neurological:     Sensory: No sensory deficit.     Motor: No weakness.     Procedures  Procedures   Medical Decision Making   Medical Complexity:    New and requires workup.     Pertinent labs & imaging results that were available during my care of the patient were reviewed by me and considered in my medical decision making.     I reviewed previous medical records.     I independently visualized image(s), tracing(s), and/or specimen(s).   58 year old female presents the emergency department for right arm pain.  Symptoms have been going on for about 4 months since a fall.  Feels her pain in her right shoulder and is unable to put her arm behind her back or raise it above her head.  Also has pain radiating from her shoulder down to her hand.  No recent trauma.  Has not seen a physician to follow-up  for her pain.  Patient would like MRI.  Vital signs are reassuring.  Physical examination shows pain in the right shoulder also difficulty with empty can test, abduction greater than 90 degrees and placing the arm behind her back.  No focal neurologic deficits.  Able to cross her fingers, abduct her fingers, make okay sign.   Differential for this patient includes but not limited to: Rotator cuff injury, fracture, dislocation, cervical radiculopathy  Plan to evaluate with x-rays of the shoulder as well as a CT scan of the neck.  Will give patient Tylenol for pain relief while she waits.  Patient will likely require outpatient follow-up  X-ray shoulder complete right minimum 2 views      CT cervical spine without contrast  ED Interpretation  Please note, there if there is  clinical concern for cervical radiculopathy, MRI of the cervical spine is recommended.    Spiculated opacity within the right lung apex may represent apical scarring, although this is incompletely evaluated (3:1). If patient is high risk for lung malignancy, recommend further evaluation with CT chest.  No acute fracture or traumatic listhesis.  Follow up final report in the AM.     CT scans and x-ray did not show exact causes for her pain.  I placed a referral for orthopedic surgery and had her follow-up with her primary care doctor.  Given return precautions and discharge.       Medications Administered in the Emergency Department   acetaminophen (TYLENOL) tablet 975 mg (975 mg Oral Given 01/08/24 1600)   ED Clinical Impression  1. Acute pain of right shoulder      ED Disposition  Discharge       Referrals  Procedures  . Ambulatory Referral to Orthopedic Surgery     [facility], Toribio Pac, MD Resident 01/08/24 [phone]

## 2024-06-24 ENCOUNTER — Other Ambulatory Visit: Payer: Self-pay

## 2024-06-24 ENCOUNTER — Emergency Department: Admission: EM | Admit: 2024-06-24 | Discharge: 2024-06-25 | Disposition: A | Discharge status: Home / Self Care

## 2024-06-24 DIAGNOSIS — E119 Type 2 diabetes mellitus without complications: Secondary | ICD-10-CM | POA: Insufficient documentation

## 2024-06-24 DIAGNOSIS — R10A2 Flank pain, left side: Secondary | ICD-10-CM | POA: Diagnosis present

## 2024-06-24 NOTE — ED Provider Notes (Signed)
 [facility] Provider Note    Event Date/Time   First MD Initiated Contact with Patient 06/24/24 2356     (approximate)   History   Flank Pain   HPI  Patient is Arabic speaking only and history and physical was obtained using the iPad Arabic interpreter.  Martha Rush is a 58 y.o. female with a past medical history of type 2 diabetes and nephrolithiasis who presents with 1 day of left flank pain.  Pain is located in her left flank and radiates to her groin.  She denies any hematuria or dysuria.  Pain is similar to prior episodes of renal stones.  Denies any changes in bowel habits.  Denies any right-sided abdominal pain.  Denies any chest pain shortness of breath.  She has been taking home diclofonec without any resolve of her symptoms.  She presents with her son who helps contribute to the history      Physical Exam   Triage Vital Signs: ED Triage Vitals [06/24/24 2345]  Encounter Vitals Group     BP 127/83     Girls Systolic BP Percentile      Girls Diastolic BP Percentile      Boys Systolic BP Percentile      Boys Diastolic BP Percentile      Pulse Rate 93     Resp 18     Temp 98.2 F (36.8 C)     Temp Source Oral     SpO2 100 %     Weight      Height 5' 3 (1.6 m)     Head Circumference      Peak Flow      Pain Score 10     Pain Loc      Pain Education      Exclude from Growth Chart     Most recent vital signs: Vitals:   06/25/24 0030 06/25/24 0200  BP: (!) 141/98 (!) 121/57  Pulse: 96 72  Resp:  19  Temp:  98.6 F (37 C)  SpO2: 100% 100%    Nursing Triage Note reviewed. Vital signs reviewed and patients oxygen saturation is normoxic  General: Patient is well nourished, well developed, awake and alert, resting comfortably in no acute distress Head: Normocephalic and atraumatic Eyes: Normal inspection, extraocular muscles intact, no conjunctival pallor Ear, nose, throat: Normal external exam Neck: Normal range of  motion Respiratory: Patient is in no respiratory distress, lungs CTAB Cardiovascular: Patient is not tachycardic, RRR without murmur appreciated GI: Abd SNT with no guarding or rebound  Very mild left CVA tenderness to palpation Back: Normal inspection of the back with good strength and range of motion throughout all ext Extremities: pulses intact with good cap refills, no LE pitting edema or calf tenderness Neuro: The patient is alert and oriented to person, place, and time, appropriately conversive, with 5/5 bilat UE/LE strength, no gross motor or sensory defects noted. Coordination appears to be adequate. Skin: Warm, dry, and intact Psych: normal mood and affect, no SI or HI  ED Results / Procedures / Treatments   Labs (all labs ordered are listed, but only abnormal results are displayed) Labs Reviewed  URINALYSIS, ROUTINE W REFLEX MICROSCOPIC - Abnormal; Notable for the following components:      Result Value   Color, Urine YELLOW (*)    APPearance CLEAR (*)    Glucose, UA >=500 (*)    Leukocytes,Ua TRACE (*)    Bacteria, UA RARE (*)  All other components within normal limits  CBC - Abnormal; Notable for the following components:   Hemoglobin 11.6 (*)    HCT 35.0 (*)    All other components within normal limits  BASIC METABOLIC PANEL WITH GFR - Abnormal; Notable for the following components:   Glucose, Bld 110 (*)    All other components within normal limits  URINE CULTURE  HEPATIC FUNCTION PANEL     EKG None  RADIOLOGY CT renal stone: No acute abnormality on my independent review interpretation radiologist agrees    PROCEDURES:  Critical Care performed: No  Procedures   MEDICATIONS ORDERED IN ED: Medications  sodium chloride 0.9 % bolus 1,000 mL (0 mLs Intravenous Stopped 06/25/24 0157)  ketorolac (TORADOL) 15 MG/ML injection 15 mg (15 mg Intravenous Given 06/25/24 0030)  morphine (PF) 4 MG/ML injection 4 mg (4 mg Intravenous Given 06/25/24 0031)   ondansetron (ZOFRAN) injection 4 mg (4 mg Intravenous Given 06/25/24 0030)     IMPRESSION / MDM / ASSESSMENT AND PLAN / ED COURSE                                Differential diagnosis includes, but is not limited to, nephrolithiasis, UTI, electrolyte derangement, anemia, acute renal insufficiency, diverticulitis  ED course: Patient is well-appearing and she has no abdominal tenderness to palpation and only very mild left CVA tenderness to palpation.  Urinalysis does not seem consistent with UTI.  However given her symptoms I did send a urine culture.  She had no acute renal insufficiency no acute electrolyte derangements and no leukocytosis.  CT renal was unremarkable.  Patient was counseled that I cannot rule out that she did not pass in his stone earlier today but she feels comfortable returning home at this time.  She will follow-up with her primary care physician.   Clinical Course as of 06/25/24 0727  Sun Jun 25, 2024  0106 WBC: 6.0 No leukocytosis [HD]  0114 Creatinine: 0.54 Not elevated [HD]  0114 Urinalysis, Routine w reflex microscopic -Urine, Clean Catch(!) Inconclusive for UTI will send for urine culture [HD]  0155 Patient reassessed, lying comfortably in bed.  Pain is not replicated by palpation.  I reviewed all of the workup today with the patient and her son and they voiced understanding.  They already have a primary care physician appointment scheduled in 3 days time.  Patient requests some pain medication should her pain return and I will send her with a small prescription of Percocet of which she was advised to not operate machinery or drive while taking this and to let her family members know.  She voiced understanding.  All questions answered and patient voiced understanding and requested discharge [HD]    Clinical Course User Index [HD] Nicholaus Rolland BRAVO, MD   At time of discharge there is no evidence of acute life, limb, vision, or fertility threat. Patient has stable  vital signs, pain is well controlled, patient is ambulatory and p.o. tolerant.  Discharge instructions were completed using the EPIC system. I would refer you to those at this time. All warnings prescriptions follow-up etc. were discussed in detail with the patient. Patient indicates understanding and is agreeable with this plan. All questions answered.  Patient is made aware that they may return to the emergency department for any worsening or new condition or for any other emergency.   -- Risk: 5 This patient has a high risk of morbidity  due to further diagnostic testing or treatment. Rationale: This patient's evaluation and management involve a high risk of morbidity due to the potential severity of presenting symptoms, need for diagnostic testing, and/or initiation of treatment that may require close monitoring. The differential includes conditions with potential for significant deterioration or requiring escalation of care. Treatment decisions in the ED, including medication administration, procedural interventions, or disposition planning, reflect this level of risk. COPA: 5 The patient has the following acute or chronic illness/injury that poses a possible threat to life or bodily function: [X] : The patient has a potentially serious acute condition or an acute exacerbation of a chronic illness requiring urgent evaluation and management in the Emergency Department. The clinical presentation necessitates immediate consideration of life-threatening or function-threatening diagnoses, even if they are ultimately ruled out.   FINAL CLINICAL IMPRESSION(S) / ED DIAGNOSES   Final diagnoses:  Left flank pain     Rx / DC Orders   ED Discharge Orders          Ordered    oxyCODONE-acetaminophen (PERCOCET) 5-325 MG tablet  Every 4 hours PRN        06/25/24 0157             Note:  This document was prepared using Dragon voice recognition software and may include unintentional dictation  errors.   Nicholaus Rolland BRAVO, MD 06/25/24 [phone]

## 2024-06-24 NOTE — ED Triage Notes (Signed)
 Pt presented to ED with c/o left sided flank pain x 3 days. Hx kidney stones. Endorses urinary frequency and dysuria. Denies fevers, n/v.

## 2024-06-24 NOTE — ED Provider Notes (Incomplete)
 [facility] Provider Note    Event Date/Time   First MD Initiated Contact with Patient 06/24/24 2356     (approximate)   History   Flank Pain   HPI  Martha Rush is a 58 y.o. female  DM       Physical Exam   Triage Vital Signs: ED Triage Vitals [06/24/24 2345]  Encounter Vitals Group     BP 127/83     Girls Systolic BP Percentile      Girls Diastolic BP Percentile      Boys Systolic BP Percentile      Boys Diastolic BP Percentile      Pulse Rate 93     Resp 18     Temp 98.2 F (36.8 C)     Temp Source Oral     SpO2 100 %     Weight      Height 5' 3 (1.6 m)     Head Circumference      Peak Flow      Pain Score 10     Pain Loc      Pain Education      Exclude from Growth Chart     Most recent vital signs: Vitals:   06/24/24 2345  BP: 127/83  Pulse: 93  Resp: 18  Temp: 98.2 F (36.8 C)  SpO2: 100%    Nursing Triage Note reviewed. Vital signs reviewed and patients oxygen saturation is normoxic***  General: Patient is well nourished, well developed, awake and alert, resting comfortably in no acute distress Head: Normocephalic and atraumatic Eyes: Normal inspection, extraocular muscles intact, no conjunctival pallor Ear, nose, throat: Normal external exam Neck: Normal range of motion Respiratory: Patient is in no respiratory distress, lungs CTAB Cardiovascular: Patient is not tachycardic, RRR without murmur appreciated GI: Abd SNT with no guarding or rebound  Back: Normal inspection of the back with good strength and range of motion throughout all ext Extremities: pulses intact with good cap refills, no LE pitting edema or calf tenderness Neuro: The patient is alert and oriented to person, place, and time, appropriately conversive, with 5/5 bilat UE/LE strength, no gross motor or sensory defects noted. Coordination appears to be adequate. Skin: Warm, dry, and intact Psych: normal mood and affect, no SI or HI  ED Results /  Procedures / Treatments   Labs (all labs ordered are listed, but only abnormal results are displayed) Labs Reviewed  URINALYSIS, ROUTINE W REFLEX MICROSCOPIC  CBC  BASIC METABOLIC PANEL WITH GFR     EKG   RADIOLOGY ***    PROCEDURES:  Critical Care performed: {CriticalCareYesNo:19197::Yes, see critical care procedure note(s),No}  Procedures   MEDICATIONS ORDERED IN ED: Medications - No data to display   IMPRESSION / MDM / ASSESSMENT AND PLAN / ED COURSE                                Differential diagnosis includes, but is not limited to, ***    ***     -- Risk: 5 This patient has a high risk of morbidity due to further diagnostic testing or treatment. Rationale: This patient's evaluation and management involve a high risk of morbidity due to the potential severity of presenting symptoms, need for diagnostic testing, and/or initiation of treatment that may require close monitoring. The differential includes conditions with potential for significant deterioration or requiring escalation of care. Treatment decisions in the ED,  including medication administration, procedural interventions, or disposition planning, reflect this level of risk. COPA: 5 The patient has the following acute or chronic illness/injury that poses a possible threat to life or bodily function: [X] : The patient has a potentially serious acute condition or an acute exacerbation of a chronic illness requiring urgent evaluation and management in the Emergency Department. The clinical presentation necessitates immediate consideration of life-threatening or function-threatening diagnoses, even if they are ultimately ruled out.   FINAL CLINICAL IMPRESSION(S) / ED DIAGNOSES   Final diagnoses:  None     Rx / DC Orders   ED Discharge Orders     None        Note:  This document was prepared using Dragon voice recognition software and may include unintentional dictation errors.

## 2024-06-25 ENCOUNTER — Emergency Department

## 2024-06-25 LAB — URINALYSIS, ROUTINE W REFLEX MICROSCOPIC
Bilirubin Urine: NEGATIVE
Glucose, UA: >=500 mg/dL — AB
Hgb urine dipstick: NEGATIVE
Ketones, ur: NEGATIVE mg/dL
Nitrite: NEGATIVE
Protein, ur: NEGATIVE mg/dL
Specific Gravity, Urine: 1.026 (ref 1.005–1.030)
pH: 7 (ref 5.0–8.0)

## 2024-06-25 LAB — BASIC METABOLIC PANEL WITH GFR
Anion gap: 9 (ref 5–15)
BUN: 20 mg/dL (ref 6–20)
CO2: 28 mmol/L (ref 22–32)
Calcium: 9 mg/dL (ref 8.9–10.3)
Chloride: 101 mmol/L (ref 98–111)
Creatinine, Ser: 0.54 mg/dL (ref 0.44–1.00)
GFR, Estimated: >60 mL/min (ref >60)
Glucose, Bld: 110 mg/dL — ABNORMAL HIGH (ref 70–99)
Potassium: 4.1 mmol/L (ref 3.5–5.1)
Sodium: 138 mmol/L (ref 135–145)

## 2024-06-25 LAB — CBC
HCT: 35 % — ABNORMAL LOW (ref 36.0–46.0)
Hemoglobin: 11.6 g/dL — ABNORMAL LOW (ref 12.0–15.0)
MCH: 29.8 pg (ref 26.0–34.0)
MCHC: 33.1 g/dL (ref 30.0–36.0)
MCV: 90 fL (ref 80.0–100.0)
Platelets: 374 K/uL (ref 150–400)
RBC: 3.89 MIL/uL (ref 3.87–5.11)
RDW: 11.9 % (ref 11.5–15.5)
WBC: 6 K/uL (ref 4.0–10.5)
nRBC: 0 % (ref 0.0–0.2)

## 2024-06-25 LAB — HEPATIC FUNCTION PANEL
ALT: 19 U/L (ref 0–44)
AST: 26 U/L (ref 15–41)
Albumin: 3.8 g/dL (ref 3.5–5.0)
Alkaline Phosphatase: 54 U/L (ref 38–126)
Bilirubin, Direct: <0.1 mg/dL (ref 0.0–0.2)
Total Bilirubin: 0.4 mg/dL (ref 0.0–1.2)
Total Protein: 7.4 g/dL (ref 6.5–8.1)

## 2024-06-25 MED ORDER — MORPHINE SULFATE (PF) 4 MG/ML IV SOLN
4.0000 mg | Freq: Once | INTRAVENOUS | Status: AC
  Administered 2024-06-25: 4 mg via INTRAVENOUS
  Filled 2024-06-25: qty 1

## 2024-06-25 MED ORDER — KETOROLAC TROMETHAMINE 15 MG/ML IJ SOLN
15.0000 mg | Freq: Once | INTRAMUSCULAR | Status: AC
  Administered 2024-06-25: 15 mg via INTRAVENOUS
  Filled 2024-06-25: qty 1

## 2024-06-25 MED ORDER — OXYCODONE-ACETAMINOPHEN 5-325 MG PO TABS: 1.0000 | ORAL_TABLET | ORAL | 0 refills | Status: AC | PRN

## 2024-06-25 MED ORDER — ONDANSETRON HCL 4 MG/2ML IJ SOLN
4.0000 mg | Freq: Once | INTRAMUSCULAR | Status: AC
  Administered 2024-06-25: 4 mg via INTRAVENOUS
  Filled 2024-06-25: qty 2

## 2024-06-25 MED ORDER — SODIUM CHLORIDE 0.9 % IV BOLUS
1000.0000 mL | Freq: Once | INTRAVENOUS | Status: AC
  Administered 2024-06-25: 1000 mL via INTRAVENOUS

## 2024-06-25 NOTE — Discharge Instructions (Signed)

## 2024-06-27 LAB — URINE CULTURE: Culture: >=100000 — AB

## 2024-06-28 NOTE — Progress Notes (Signed)
 ED Antimicrobial Stewardship Positive Culture Follow Up   Martha Rush is an 58 y.o. female who presented to [facility] on 06/24/2024 with a chief complaint of  Chief Complaint  Patient presents with   Flank Pain    Recent Results (from the past 720 hours)  Urine Culture     Status: Abnormal   Collection Time: 06/24/24 11:49 PM   Specimen: Urine, Random  Result Value Ref Range Status   Specimen Description   Final    URINE, RANDOM Performed at [facility], [address] [place], [place] [address]    Special Requests   Final    NONE Performed at [facility], [address] [place], [place] [address]    Culture (A)  Final    >=100,000 COLONIES/mL ESCHERICHIA COLI Confirmed Extended Spectrum Beta-Lactamase Producer (ESBL).  In bloodstream infections from ESBL organisms, carbapenems are preferred over piperacillin/tazobactam. They are shown to have a lower risk of mortality.    Report Status 06/27/2024 FINAL  Final   Organism ID, Bacteria ESCHERICHIA COLI (A)  Final      Susceptibility   Escherichia coli - MIC*    AMPICILLIN >=32 RESISTANT Resistant     CEFAZOLIN (URINE) Value in next row Resistant      >=32 RESISTANTThis is a modified FDA-approved test that has been validated and its performance characteristics determined by the reporting laboratory.  This laboratory is certified under the Clinical Laboratory Improvement Amendments CLIA as qualified to perform high complexity clinical laboratory testing.    CEFEPIME Value in next row Resistant      >=32 RESISTANTThis is a modified FDA-approved test that has been validated and its performance characteristics determined by the reporting laboratory.  This laboratory is certified under the Clinical Laboratory Improvement Amendments CLIA as qualified to perform high complexity clinical laboratory testing.    ERTAPENEM Value in next row Sensitive      >=32 RESISTANTThis is a modified FDA-approved test that has  been validated and its performance characteristics determined by the reporting laboratory.  This laboratory is certified under the Clinical Laboratory Improvement Amendments CLIA as qualified to perform high complexity clinical laboratory testing.    CEFTRIAXONE Value in next row Resistant      >=32 RESISTANTThis is a modified FDA-approved test that has been validated and its performance characteristics determined by the reporting laboratory.  This laboratory is certified under the Clinical Laboratory Improvement Amendments CLIA as qualified to perform high complexity clinical laboratory testing.    CIPROFLOXACIN Value in next row Intermediate      >=32 RESISTANTThis is a modified FDA-approved test that has been validated and its performance characteristics determined by the reporting laboratory.  This laboratory is certified under the Clinical Laboratory Improvement Amendments CLIA as qualified to perform high complexity clinical laboratory testing.    GENTAMICIN Value in next row Sensitive      >=32 RESISTANTThis is a modified FDA-approved test that has been validated and its performance characteristics determined by the reporting laboratory.  This laboratory is certified under the Clinical Laboratory Improvement Amendments CLIA as qualified to perform high complexity clinical laboratory testing.    NITROFURANTOIN Value in next row Sensitive      >=32 RESISTANTThis is a modified FDA-approved test that has been validated and its performance characteristics determined by the reporting laboratory.  This laboratory is certified under the Clinical Laboratory Improvement Amendments CLIA as qualified to perform high complexity clinical laboratory testing.    TRIMETH/SULFA Value in next row  Sensitive      >=32 RESISTANTThis is a modified FDA-approved test that has been validated and its performance characteristics determined by the reporting laboratory.  This laboratory is certified under the Clinical Laboratory  Improvement Amendments CLIA as qualified to perform high complexity clinical laboratory testing.    AMPICILLIN/SULBACTAM Value in next row Intermediate      >=32 RESISTANTThis is a modified FDA-approved test that has been validated and its performance characteristics determined by the reporting laboratory.  This laboratory is certified under the Clinical Laboratory Improvement Amendments CLIA as qualified to perform high complexity clinical laboratory testing.    PIP/TAZO Value in next row Sensitive      <=4 SENSITIVEThis is a modified FDA-approved test that has been validated and its performance characteristics determined by the reporting laboratory.  This laboratory is certified under the Clinical Laboratory Improvement Amendments CLIA as qualified to perform high complexity clinical laboratory testing.    MEROPENEM Value in next row Sensitive      <=4 SENSITIVEThis is a modified FDA-approved test that has been validated and its performance characteristics determined by the reporting laboratory.  This laboratory is certified under the Clinical Laboratory Improvement Amendments CLIA as qualified to perform high complexity clinical laboratory testing.    * >=100,000 COLONIES/mL ESCHERICHIA COLI    []  Treated with , organism resistant to prescribed antimicrobial [x]  Patient discharged originally without antimicrobial agent and treatment is now indicated  New antibiotic prescription: Bactrim 1 DS BID x 7 days   ED Provider: Dr. Willo  58 yo F presented to ED with left sided flank pain x 3 days, urinary frequency, dysuria. PMH significant for nephrolithiasis and DM. In ED, UA was collected, and was not consistent with a UTI. However, due to symptoms, urine culture was also sent. CT renal was done and unremarkable. Urine cultures are back and now growing >100,000 CFU of ESBL E. Coli. Could be colonization vs real pathogen. If treating, would recommend Bactrim 1 DS BID x 7 days for pyelonephritis.  Discussed this with ED provider, who elected to treat given presenting with flank pain/UTI symptoms initially. Patient only speaks Arabic. Used an interpreter to call the patient regarding plans, but call went to voicemail. Left a message to give us  a call back regarding recent lab results and visit. Tried calling again at a later time, and still no answer. Next, tried to call son, but also unsuccessful. Will follow-up the next day and see if able to get in contact with patient.    Ransom Blanch PGY-1 Pharmacy Resident  [facility]  06/28/2024 5:15 PM
